# Patient Record
Sex: Female | Born: 1965 | Race: White | Hispanic: No | Marital: Single | Smoking: Current every day smoker
Health system: Southern US, Community
[De-identification: ages and names within clinical notes are randomized; demographics above are authoritative.]

## PROBLEM LIST (undated history)

## (undated) DIAGNOSIS — I10 Essential (primary) hypertension: Secondary | ICD-10-CM

## (undated) DIAGNOSIS — B1920 Unspecified viral hepatitis C without hepatic coma: Secondary | ICD-10-CM

## (undated) DIAGNOSIS — K7460 Unspecified cirrhosis of liver: Secondary | ICD-10-CM

## (undated) HISTORY — DX: Unspecified cirrhosis of liver: K74.60

## (undated) HISTORY — DX: Unspecified viral hepatitis C without hepatic coma: B19.20

## (undated) HISTORY — PX: ANKLE FRACTURE SURGERY: SHX122

---

## 1990-02-23 HISTORY — PX: CHOLECYSTECTOMY: SHX55

## 1995-02-24 HISTORY — PX: BACK SURGERY: SHX140

## 1997-05-24 ENCOUNTER — Emergency Department (HOSPITAL_COMMUNITY): Admission: EM | Admit: 1997-05-24 | Discharge: 1997-05-24 | Payer: Self-pay | Admitting: Emergency Medicine

## 1997-05-28 ENCOUNTER — Ambulatory Visit (HOSPITAL_COMMUNITY): Admission: RE | Admit: 1997-05-28 | Discharge: 1997-05-28 | Payer: Self-pay | Admitting: Orthopedic Surgery

## 1997-09-07 ENCOUNTER — Emergency Department (HOSPITAL_COMMUNITY): Admission: EM | Admit: 1997-09-07 | Discharge: 1997-09-07 | Payer: Self-pay | Admitting: Emergency Medicine

## 1997-10-31 ENCOUNTER — Emergency Department (HOSPITAL_COMMUNITY): Admission: EM | Admit: 1997-10-31 | Discharge: 1997-10-31 | Payer: Self-pay | Admitting: Emergency Medicine

## 1997-11-27 ENCOUNTER — Emergency Department (HOSPITAL_COMMUNITY): Admission: EM | Admit: 1997-11-27 | Discharge: 1997-11-27 | Payer: Self-pay | Admitting: Emergency Medicine

## 1998-02-20 ENCOUNTER — Emergency Department (HOSPITAL_COMMUNITY): Admission: EM | Admit: 1998-02-20 | Discharge: 1998-02-20 | Payer: Self-pay | Admitting: Emergency Medicine

## 1998-07-16 ENCOUNTER — Emergency Department (HOSPITAL_COMMUNITY): Admission: EM | Admit: 1998-07-16 | Discharge: 1998-07-16 | Payer: Self-pay | Admitting: Emergency Medicine

## 1998-08-27 ENCOUNTER — Emergency Department (HOSPITAL_COMMUNITY): Admission: EM | Admit: 1998-08-27 | Discharge: 1998-08-27 | Payer: Self-pay | Admitting: Emergency Medicine

## 1998-12-23 ENCOUNTER — Emergency Department (HOSPITAL_COMMUNITY): Admission: EM | Admit: 1998-12-23 | Discharge: 1998-12-23 | Payer: Self-pay | Admitting: Emergency Medicine

## 1998-12-23 ENCOUNTER — Encounter: Payer: Self-pay | Admitting: Emergency Medicine

## 2002-02-23 HISTORY — PX: HEEL SPUR SURGERY: SHX665

## 2002-05-25 HISTORY — PX: OTHER SURGICAL HISTORY: SHX169

## 2007-12-22 ENCOUNTER — Ambulatory Visit (HOSPITAL_COMMUNITY): Admission: RE | Admit: 2007-12-22 | Discharge: 2007-12-23 | Payer: Self-pay | Admitting: Obstetrics and Gynecology

## 2007-12-22 ENCOUNTER — Encounter (INDEPENDENT_AMBULATORY_CARE_PROVIDER_SITE_OTHER): Payer: Self-pay | Admitting: Obstetrics and Gynecology

## 2008-01-08 ENCOUNTER — Inpatient Hospital Stay (HOSPITAL_COMMUNITY): Admission: AD | Admit: 2008-01-08 | Discharge: 2008-01-11 | Payer: Self-pay | Admitting: Obstetrics and Gynecology

## 2008-01-09 ENCOUNTER — Encounter: Payer: Self-pay | Admitting: Obstetrics and Gynecology

## 2008-02-24 HISTORY — PX: ABDOMINAL HYSTERECTOMY: SHX81

## 2008-10-19 ENCOUNTER — Emergency Department: Payer: Self-pay | Admitting: Emergency Medicine

## 2009-03-02 ENCOUNTER — Emergency Department (HOSPITAL_COMMUNITY): Admission: EM | Admit: 2009-03-02 | Discharge: 2009-03-02 | Payer: Self-pay | Admitting: Emergency Medicine

## 2009-03-06 ENCOUNTER — Emergency Department (HOSPITAL_COMMUNITY): Admission: EM | Admit: 2009-03-06 | Discharge: 2009-03-06 | Payer: Self-pay | Admitting: Emergency Medicine

## 2009-08-26 ENCOUNTER — Emergency Department (HOSPITAL_COMMUNITY): Admission: EM | Admit: 2009-08-26 | Discharge: 2009-08-26 | Payer: Self-pay | Admitting: Emergency Medicine

## 2009-12-28 ENCOUNTER — Emergency Department (HOSPITAL_COMMUNITY): Admission: EM | Admit: 2009-12-28 | Discharge: 2009-12-28 | Payer: Self-pay | Admitting: Emergency Medicine

## 2010-02-01 ENCOUNTER — Emergency Department (HOSPITAL_COMMUNITY)
Admission: EM | Admit: 2010-02-01 | Discharge: 2010-02-01 | Payer: Self-pay | Source: Home / Self Care | Admitting: Emergency Medicine

## 2010-02-23 DIAGNOSIS — I10 Essential (primary) hypertension: Secondary | ICD-10-CM

## 2010-02-23 HISTORY — DX: Essential (primary) hypertension: I10

## 2010-03-09 ENCOUNTER — Emergency Department (HOSPITAL_COMMUNITY)
Admission: EM | Admit: 2010-03-09 | Discharge: 2010-03-10 | Payer: Self-pay | Source: Home / Self Care | Admitting: Emergency Medicine

## 2010-04-25 ENCOUNTER — Emergency Department (HOSPITAL_COMMUNITY): Payer: Self-pay

## 2010-04-25 ENCOUNTER — Emergency Department (HOSPITAL_COMMUNITY)
Admission: EM | Admit: 2010-04-25 | Discharge: 2010-04-26 | Disposition: A | Discharge status: Home / Self Care | Payer: Self-pay | Attending: Emergency Medicine | Admitting: Emergency Medicine

## 2010-04-25 DIAGNOSIS — Y929 Unspecified place or not applicable: Secondary | ICD-10-CM | POA: Insufficient documentation

## 2010-04-25 DIAGNOSIS — R079 Chest pain, unspecified: Secondary | ICD-10-CM | POA: Insufficient documentation

## 2010-04-25 DIAGNOSIS — M2569 Stiffness of other specified joint, not elsewhere classified: Secondary | ICD-10-CM | POA: Insufficient documentation

## 2010-04-25 DIAGNOSIS — I10 Essential (primary) hypertension: Secondary | ICD-10-CM | POA: Insufficient documentation

## 2010-04-25 DIAGNOSIS — S0003XA Contusion of scalp, initial encounter: Secondary | ICD-10-CM | POA: Insufficient documentation

## 2010-04-25 DIAGNOSIS — R51 Headache: Secondary | ICD-10-CM | POA: Insufficient documentation

## 2010-04-25 DIAGNOSIS — R4182 Altered mental status, unspecified: Secondary | ICD-10-CM | POA: Insufficient documentation

## 2010-04-25 DIAGNOSIS — S0083XA Contusion of other part of head, initial encounter: Secondary | ICD-10-CM | POA: Insufficient documentation

## 2010-04-25 DIAGNOSIS — F411 Generalized anxiety disorder: Secondary | ICD-10-CM | POA: Insufficient documentation

## 2010-04-25 DIAGNOSIS — IMO0002 Reserved for concepts with insufficient information to code with codable children: Secondary | ICD-10-CM | POA: Insufficient documentation

## 2010-04-25 DIAGNOSIS — F29 Unspecified psychosis not due to a substance or known physiological condition: Secondary | ICD-10-CM | POA: Insufficient documentation

## 2010-04-25 DIAGNOSIS — Z79899 Other long term (current) drug therapy: Secondary | ICD-10-CM | POA: Insufficient documentation

## 2010-04-25 LAB — URINE MICROSCOPIC-ADD ON

## 2010-04-25 LAB — URINALYSIS, ROUTINE W REFLEX MICROSCOPIC
Bilirubin Urine: NEGATIVE
Glucose, UA: NEGATIVE mg/dL
Hgb urine dipstick: NEGATIVE
Ketones, ur: NEGATIVE mg/dL
Nitrite: NEGATIVE
Protein, ur: NEGATIVE mg/dL
Specific Gravity, Urine: 1.012 (ref 1.005–1.030)
Urobilinogen, UA: 0.2 mg/dL (ref 0.0–1.0)
pH: 5.5 (ref 5.0–8.0)

## 2010-04-25 LAB — CBC
HCT: 35.8 % — ABNORMAL LOW (ref 36.0–46.0)
Hemoglobin: 12.3 g/dL (ref 12.0–15.0)
MCH: 32.9 pg (ref 26.0–34.0)
MCHC: 34.4 g/dL (ref 30.0–36.0)
MCV: 95.7 fL (ref 78.0–100.0)
Platelets: 157 10*3/uL (ref 150–400)
RBC: 3.74 MIL/uL — ABNORMAL LOW (ref 3.87–5.11)
RDW: 12.1 % (ref 11.5–15.5)
WBC: 10.6 10*3/uL — ABNORMAL HIGH (ref 4.0–10.5)

## 2010-04-25 LAB — DIFFERENTIAL
Basophils Absolute: 0 10*3/uL (ref 0.0–0.1)
Basophils Relative: 0 % (ref 0–1)
Eosinophils Absolute: 0.1 10*3/uL (ref 0.0–0.7)
Eosinophils Relative: 1 % (ref 0–5)
Lymphocytes Relative: 26 % (ref 12–46)
Lymphs Abs: 2.8 10*3/uL (ref 0.7–4.0)
Monocytes Absolute: 0.4 10*3/uL (ref 0.1–1.0)
Monocytes Relative: 4 % (ref 3–12)
Neutro Abs: 7.3 10*3/uL (ref 1.7–7.7)
Neutrophils Relative %: 69 % (ref 43–77)

## 2010-04-25 LAB — GLUCOSE, CAPILLARY: Glucose-Capillary: 83 mg/dL (ref 70–99)

## 2010-04-25 LAB — POCT I-STAT, CHEM 8
BUN: 11 mg/dL (ref 6–23)
Calcium, Ion: 1.17 mmol/L (ref 1.12–1.32)
Chloride: 107 mEq/L (ref 96–112)
Creatinine, Ser: 0.8 mg/dL (ref 0.4–1.2)
Glucose, Bld: 81 mg/dL (ref 70–99)
HCT: 37 % (ref 36.0–46.0)
Hemoglobin: 12.6 g/dL (ref 12.0–15.0)
Potassium: 3.8 mEq/L (ref 3.5–5.1)
Sodium: 141 mEq/L (ref 135–145)
TCO2: 24 mmol/L (ref 0–100)

## 2010-04-25 LAB — RAPID URINE DRUG SCREEN, HOSP PERFORMED
Amphetamines: NOT DETECTED
Barbiturates: NOT DETECTED
Benzodiazepines: POSITIVE — AB
Cocaine: NOT DETECTED
Opiates: NOT DETECTED
Tetrahydrocannabinol: NOT DETECTED

## 2010-04-25 LAB — PREGNANCY, URINE: Preg Test, Ur: NEGATIVE

## 2010-04-26 ENCOUNTER — Emergency Department (HOSPITAL_COMMUNITY): Payer: Self-pay

## 2010-04-26 ENCOUNTER — Encounter (HOSPITAL_COMMUNITY): Payer: Self-pay

## 2010-04-26 LAB — SALICYLATE LEVEL: Salicylate Lvl: <4 mg/dL (ref 2.8–20.0)

## 2010-04-26 LAB — ETHANOL: Alcohol, Ethyl (B): <5 mg/dL (ref 0–10)

## 2010-04-26 LAB — ACETAMINOPHEN LEVEL: Acetaminophen (Tylenol), Serum: <10 ug/mL — ABNORMAL LOW (ref 10–30)

## 2010-04-26 MED ORDER — IOHEXOL 300 MG/ML  SOLN
75.0000 mL | Freq: Once | INTRAMUSCULAR | Status: AC | PRN
  Administered 2010-04-26: 75 mL via INTRAVENOUS
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
## 2010-07-08 NOTE — Op Note (Signed)
[name], [name]                ACCOUNT NO.:  [id number]   MEDICAL RECORD NO.:  [id number]          PATIENT TYPE:  AMB   LOCATION:  SDC                           [facility]ILITY:  [facility]   PHYSICIAN:  Claire [name], M.D.DATE OF BIRTH:  02/26/1965   DATE OF PROCEDURE:  12/22/2007  DATE OF DISCHARGE:                               OPERATIVE REPORT   PREOPERATIVE DIAGNOSIS:  Chronic pelvic pain.   POSTOPERATIVE DIAGNOSIS:  Chronic pelvic pain.   PROCEDURE:  Laparoscopic-assisted vaginal hysterectomy and cystoscopy.   SURGEON:  Claire Niece, MD   ASSISTANT:  Claire Pro. Claire Mantle, MD   ANESTHESIA:  General endotracheal tube.   FINDINGS:  Claire [name] had a normal-appearing uterus, tubes and ovaries.  Claire [name]  had adhesions on the right upper quadrant from previous procedures.   SPECIMENS:  Uterus with cervix sent to Pathology.   ESTIMATED BLOOD LOSS:  400 mL.   COMPLICATIONS:  None.   PROCEDURE IN DETAIL:  The patient was taken to the operating room and  placed in the dorsal supine position.  Both arms were tucked to her  sides.  General anesthesia was induced and Claire [name] was placed in mobile  stirrups.  Abdomen, perineum and vagina were then prepped and draped in  the usual sterile fashion, bladder drained with a red Robinson catheter,  Hulka tenaculum applied to the cervix for uterine manipulation.  Infraumbilical skin was infiltrated with 0.25% Marcaine and a 3-cm  horizontal incision was made.  This was carried down to the fascia,  which was elevated with Kochers.  This was incised sharply.  The  posterior sheath was then identified and elevated and also entered  sharply with scissors.  Peritoneum was then entered bluntly.  A finger  was used to sweep around and make sure that there were no adhesions  under this adhesion from previous surgeries.  Army[employer] retractors were  used to expose the opening.  A pursestring suture of 0 Vicryl was placed  around the fascia and a Hassan cannula was  inserted and secured.  CO2  gas was insufflated and laparoscope was inserted.  The 5 mm ports were  then placed on each side under direct visualization.  There were  adhesions noted in the right upper quadrant from prior surgery.  The  pelvis had no significant adhesions.  Uterus, tubes and ovaries appeared  normal.  There was a suspicion of a possible hydrosalpinx on preop  ultrasound, but both tubes and ovaries appeared normal.  Using a  laparoscopic tenaculum to grab the fundus, I then used the harmonic  scalpel first on the left side to take down the round ligament, utero-  ovarian pedicles and broad ligaments.  I took this down to the level of  the uterine artery, but did not come across the anterior portion of the  peritoneum.  A similar procedure was then performed on the right side  with the harmonic scalpel.  Again, the anterior peritoneum was not  incised as it was a little bit difficult to delineate.  Bleeding from  the right uterine artery was controlled with the  harmonic scalpel.  At  this point, I elected to proceed vaginally.   Legs were elevated in stirrups.  A weighted speculum was inserted into  the vagina.  The cervix was grasped with Christella Hartigan tenaculums.  Deaver  retractors were used anteriorly and laterally.  Cervicovaginal mucosa  was infiltrated with a dilute solution of Pitressin.  This was then  incised circumferentially with electrocautery.  Sharp dissection was  then used to further free the vagina from the cervix.  The anterior  peritoneum was identified, entered sharply and a Deaver retractor used  to retract the bladder anteriorly.  Blood was evacuated with suction.  Posterior cul-de-sac was then easily identified and entered sharply.  A  bonnano speculum was placed into the posterior cul-de-sac.  Uterosacral  ligaments were clamped, transected and ligated with #1 chromic and  tagged for later use.  Uterine arteries and cardinal ligaments were  clamped,  transected and ligated on each side and the uterus was removed.  A small amount of bleeding from the patient's left side was controlled  with a figure-of-eight suture of #1 chromic.  All other pedicles  appeared to be hemostatic.  Again, tubes and ovaries appeared normal.  The uterosacral ligaments were then plicated in the midline with 2-0  silk.  The previously tagged uterosacral pedicles were also tied in the  midline.  The vaginal cuff was then closed in a vertical fashion with  running locking 2-0 Vicryl with adequate closure and adequate  hemostasis.   Attention was turned to cystoscopy.  The patient was given indigo  carmine IV.  A 70-degree cystoscope was inserted and 200 mL of sterile  solution was instilled.  The bladder appeared normal.  Both ureteral  orifices were easily identified and had good jets of indigo tinted  urine.  The cystoscope was removed and a Foley catheter was placed.   Attention was turned back to laparoscopy.  Both Dr. Ambrose [name] and myself  and the scrub tech changed gloves.  The laparoscope was inserted and the  pelvis was irrigated.  Bleeding from the left ovary and left pelvic  sidewall was controlled with the harmonic scalpel.  There was a small  amount of bleeding from the vaginal cuff.  I was not able to isolate  this through the laparoscope.  With Dr. Ambrose [name] looking through the  scope, I went back vaginally and placed a Graves speculum.  I then  placed a deep suture at the level in the vaginal cuff where I felt like  the bleeding was coming from.  This was a figure-of-eight suture of 0  Vicryl with good placement.  No further bleeding was noted from the  vaginal cuff.  I changed gloves again after all instruments were removed  from the vagina.  Irrigation again to the laparoscope revealed all  pedicles to be hemostatic.  The 5 mm ports were removed.  The Hassan  cannula was removed and all gas allowed to deflate from the abdomen.  The umbilical  incision was closed with the previously placed pursestring  suture and this felt adequate.  Skin incisions were closed with  interrupted subcuticular sutures of 4-0 Vicryl followed by Dermabond.  The patient was taken down from stirrups.  Claire [name] was awakened in the  operating room and taken to the recovery room in stable condition after  tolerating the procedure well.  Counts were correct, Claire [name] received Ancef  1 g IV at the beginning of the procedure and had PAS hose on throughout  the procedure.      Claire [name], M.D.  Electronically Signed     TDM/MEDQ  D:  12/22/2007  T:  12/22/2007  Job:  [id number]

## 2010-09-21 ENCOUNTER — Emergency Department (HOSPITAL_COMMUNITY)
Admission: EM | Admit: 2010-09-21 | Discharge: 2010-09-22 | Disposition: A | Discharge status: Home / Self Care | Payer: Self-pay | Attending: Emergency Medicine | Admitting: Emergency Medicine

## 2010-09-21 DIAGNOSIS — Z79899 Other long term (current) drug therapy: Secondary | ICD-10-CM | POA: Insufficient documentation

## 2010-09-21 DIAGNOSIS — M549 Dorsalgia, unspecified: Secondary | ICD-10-CM | POA: Insufficient documentation

## 2010-09-21 DIAGNOSIS — Z049 Encounter for examination and observation for unspecified reason: Secondary | ICD-10-CM | POA: Insufficient documentation

## 2010-09-21 DIAGNOSIS — F411 Generalized anxiety disorder: Secondary | ICD-10-CM | POA: Insufficient documentation

## 2010-09-21 DIAGNOSIS — I10 Essential (primary) hypertension: Secondary | ICD-10-CM | POA: Insufficient documentation

## 2010-11-25 LAB — CBC
HCT: 20.4 — ABNORMAL LOW
HCT: 22.7 — ABNORMAL LOW
HCT: 40
HCT: 25.1 — ABNORMAL LOW
HCT: 28.7 — ABNORMAL LOW
Hemoglobin: 13.5
Hemoglobin: 7 — CL
Hemoglobin: 7.8 — CL
Hemoglobin: 8.7 — ABNORMAL LOW
Hemoglobin: 9.9 — ABNORMAL LOW
MCHC: 33.8
MCHC: 34.4
MCHC: 34.5
MCHC: 34.5
MCHC: 34.7
MCV: 98.9
MCV: 98.9
MCV: 99.6
MCV: 100.6 — ABNORMAL HIGH
MCV: 99.7
Platelets: 114 — ABNORMAL LOW
Platelets: 145 — ABNORMAL LOW
Platelets: 195
Platelets: 164
Platelets: 226
RBC: 2.05 — ABNORMAL LOW
RBC: 2.29 — ABNORMAL LOW
RBC: 4.04
RBC: 2.52 — ABNORMAL LOW
RBC: 2.85 — ABNORMAL LOW
RDW: 13.1
RDW: 13.3
RDW: 13.6
RDW: 13.8
RDW: 14
WBC: 5.8
WBC: 6.4
WBC: 7.9
WBC: 4.9
WBC: 8.2

## 2010-11-25 LAB — COMPREHENSIVE METABOLIC PANEL
ALT: 12
AST: 23
Albumin: 2.8 — ABNORMAL LOW
Alkaline Phosphatase: 66
BUN: 2 — ABNORMAL LOW
CO2: 26
Calcium: 8.3 — ABNORMAL LOW
Chloride: 103
Creatinine, Ser: 0.48
GFR calc Af Amer: >60
GFR calc non Af Amer: >60
Glucose, Bld: 107 — ABNORMAL HIGH
Potassium: 3.6
Sodium: 136
Total Bilirubin: 0.6
Total Protein: 5.6 — ABNORMAL LOW

## 2010-11-25 LAB — PREGNANCY, URINE: Preg Test, Ur: NEGATIVE

## 2010-11-25 LAB — URINALYSIS, ROUTINE W REFLEX MICROSCOPIC
Bilirubin Urine: NEGATIVE
Glucose, UA: NEGATIVE
Hgb urine dipstick: NEGATIVE
Ketones, ur: NEGATIVE
Nitrite: NEGATIVE
Protein, ur: NEGATIVE
Specific Gravity, Urine: 1.01
Urobilinogen, UA: 1
pH: 7

## 2010-11-26 LAB — BODY FLUID CULTURE: Culture: NO GROWTH

## 2010-12-01 ENCOUNTER — Emergency Department (HOSPITAL_COMMUNITY)
Admission: EM | Admit: 2010-12-01 | Discharge: 2010-12-01 | Disposition: A | Discharge status: Home / Self Care | Payer: Self-pay | Attending: Emergency Medicine | Admitting: Emergency Medicine

## 2010-12-01 ENCOUNTER — Emergency Department (HOSPITAL_COMMUNITY): Payer: Self-pay

## 2010-12-01 DIAGNOSIS — S0003XA Contusion of scalp, initial encounter: Secondary | ICD-10-CM | POA: Insufficient documentation

## 2010-12-01 DIAGNOSIS — I10 Essential (primary) hypertension: Secondary | ICD-10-CM | POA: Insufficient documentation

## 2010-12-01 DIAGNOSIS — R51 Headache: Secondary | ICD-10-CM | POA: Insufficient documentation

## 2010-12-01 DIAGNOSIS — S0083XA Contusion of other part of head, initial encounter: Secondary | ICD-10-CM | POA: Insufficient documentation

## 2010-12-31 ENCOUNTER — Encounter (HOSPITAL_COMMUNITY): Payer: Self-pay

## 2010-12-31 ENCOUNTER — Emergency Department (HOSPITAL_COMMUNITY): Payer: Self-pay

## 2010-12-31 ENCOUNTER — Emergency Department (HOSPITAL_COMMUNITY)
Admission: EM | Admit: 2010-12-31 | Discharge: 2010-12-31 | Disposition: A | Discharge status: Home / Self Care | Payer: Self-pay | Attending: Emergency Medicine | Admitting: Emergency Medicine

## 2010-12-31 DIAGNOSIS — M719 Bursopathy, unspecified: Secondary | ICD-10-CM | POA: Insufficient documentation

## 2010-12-31 DIAGNOSIS — IMO0001 Reserved for inherently not codable concepts without codable children: Secondary | ICD-10-CM | POA: Insufficient documentation

## 2010-12-31 DIAGNOSIS — M542 Cervicalgia: Secondary | ICD-10-CM | POA: Insufficient documentation

## 2010-12-31 DIAGNOSIS — M25519 Pain in unspecified shoulder: Secondary | ICD-10-CM | POA: Insufficient documentation

## 2010-12-31 DIAGNOSIS — I10 Essential (primary) hypertension: Secondary | ICD-10-CM | POA: Insufficient documentation

## 2010-12-31 DIAGNOSIS — M7550 Bursitis of unspecified shoulder: Secondary | ICD-10-CM

## 2010-12-31 DIAGNOSIS — Z9889 Other specified postprocedural states: Secondary | ICD-10-CM | POA: Insufficient documentation

## 2010-12-31 DIAGNOSIS — R209 Unspecified disturbances of skin sensation: Secondary | ICD-10-CM | POA: Insufficient documentation

## 2010-12-31 DIAGNOSIS — F17200 Nicotine dependence, unspecified, uncomplicated: Secondary | ICD-10-CM | POA: Insufficient documentation

## 2010-12-31 DIAGNOSIS — Z79899 Other long term (current) drug therapy: Secondary | ICD-10-CM | POA: Insufficient documentation

## 2010-12-31 DIAGNOSIS — M79609 Pain in unspecified limb: Secondary | ICD-10-CM | POA: Insufficient documentation

## 2010-12-31 DIAGNOSIS — M7918 Myalgia, other site: Secondary | ICD-10-CM

## 2010-12-31 DIAGNOSIS — M67919 Unspecified disorder of synovium and tendon, unspecified shoulder: Secondary | ICD-10-CM | POA: Insufficient documentation

## 2010-12-31 HISTORY — DX: Essential (primary) hypertension: I10

## 2010-12-31 MED ORDER — CYCLOBENZAPRINE HCL 10 MG PO TABS: 10.0000 mg | ORAL_TABLET | Freq: Two times a day (BID) | ORAL | Status: DC | PRN

## 2010-12-31 MED ORDER — OXYCODONE-ACETAMINOPHEN 5-325 MG PO TABS: 1.0000 | ORAL_TABLET | ORAL | Status: AC | PRN

## 2010-12-31 MED ORDER — CYCLOBENZAPRINE HCL 10 MG PO TABS
10.0000 mg | ORAL_TABLET | Freq: Once | ORAL | Status: AC
  Administered 2010-12-31: 10 mg via ORAL
  Filled 2010-12-31: qty 1

## 2010-12-31 MED ORDER — IBUPROFEN 800 MG PO TABS
800.0000 mg | ORAL_TABLET | Freq: Once | ORAL | Status: AC
  Administered 2010-12-31: 800 mg via ORAL
  Filled 2010-12-31: qty 1

## 2010-12-31 MED ORDER — OXYCODONE-ACETAMINOPHEN 5-325 MG PO TABS
1.0000 | ORAL_TABLET | Freq: Once | ORAL | Status: AC
  Administered 2010-12-31: 1 via ORAL
  Filled 2010-12-31: qty 1

## 2010-12-31 MED ORDER — IBUPROFEN 800 MG PO TABS: 800.0000 mg | ORAL_TABLET | Freq: Three times a day (TID) | ORAL | Status: AC

## 2010-12-31 NOTE — ED Notes (Signed)
Complains of right shoulder pain for several days. Pain worse with movement on right arm. Denies injury but does carry heavy bookbag daily.

## 2010-12-31 NOTE — ED Provider Notes (Signed)
Medical screening examination/treatment/procedure(s) were performed by non-physician practitioner and as supervising physician I was immediately available for consultation/collaboration.   Forbes Cellar, MD 12/31/10 1100

## 2010-12-31 NOTE — ED Provider Notes (Signed)
History     CSN: [id number] Arrival date & time: 12/31/2010  9:26 AM   First MD Initiated Contact with Patient 12/31/10 1005      Chief Complaint  Patient presents with  . Shoulder Pain    right side.     (Consider location/radiation/quality/duration/timing/severity/associated sxs/prior treatment) Patient is a 45 y.o. female presenting with shoulder pain. The history is provided by the patient.  Shoulder Pain This is a new problem. The current episode started in the past 7 days. The problem occurs constantly. The problem has been gradually worsening. Associated symptoms comments: Pain started in right side neck radiating into right arm to hand, causing tingling in fingers 3-5. Shoulder pain started 1-2 days ago and has been progressively worse.Marland Kitchen    Past Medical History  Diagnosis Date  . Hypertension     Past Surgical History  Procedure Date  . Cesarean section   . Cholecystectomy   . Heel spur surgery   . Ankle fracture surgery   . Abdominal hysterectomy     History reviewed. No pertinent family history.  History  Substance Use Topics  . Smoking status: Current Everyday Smoker    Types: Cigarettes  . Smokeless tobacco: Not on file  . Alcohol Use: No    OB History    Grav Para Term Preterm Abortions TAB SAB Ect Mult Living                  Review of Systems  Constitutional: Negative.   Respiratory: Negative.   Cardiovascular: Negative.   Gastrointestinal: Negative.   Musculoskeletal:       See HPI.    Allergies  Review of patient's allergies indicates no known allergies.  Home Medications   Current Outpatient Rx  Name Route Sig Dispense Refill  . HYDROCHLOROTHIAZIDE 25 MG PO TABS Oral Take 25 mg by mouth daily.        BP 147/91  Pulse 91  Temp(Src) 98.1 F (36.7 C) (Oral)  Resp 16  SpO2 99%  Physical Exam  Constitutional: She appears well-developed and well-nourished.  HENT:  Head: Normocephalic.  Neck:       Right paracervical  tenderness with muscular tenseness without specific palpable spasm. No swelling. Tenderness extends to right trapezius.   Pulmonary/Chest: Effort normal.  Musculoskeletal:       Right shoulder point tender over bursa. No swelling or discoloration.  Neurological: She is alert. She has normal strength and normal reflexes.    ED Course  Procedures (including critical care time)  Labs Reviewed - No data to display Dg Shoulder Right  12/31/2010  *RADIOLOGY REPORT*  Clinical Data: Pain  RIGHT SHOULDER - 2+ VIEW  Comparison: None.  Findings: Negative for fracture, dislocation, or other acute abnormality.  Normal alignment and mineralization. No significant degenerative change.  Regional soft tissues unremarkable.  IMPRESSION:  Negative  Original Report Authenticated By: Osa Craver, M.D.     No diagnosis found.    MDM        Rodena Medin, PA 12/31/10 1058

## 2011-01-03 ENCOUNTER — Emergency Department (HOSPITAL_COMMUNITY)
Admission: EM | Admit: 2011-01-03 | Discharge: 2011-01-03 | Disposition: A | Discharge status: Home / Self Care | Payer: Self-pay | Attending: Emergency Medicine | Admitting: Emergency Medicine

## 2011-01-03 ENCOUNTER — Encounter (HOSPITAL_COMMUNITY): Payer: Self-pay | Admitting: Emergency Medicine

## 2011-01-03 DIAGNOSIS — F17200 Nicotine dependence, unspecified, uncomplicated: Secondary | ICD-10-CM | POA: Insufficient documentation

## 2011-01-03 DIAGNOSIS — I10 Essential (primary) hypertension: Secondary | ICD-10-CM | POA: Insufficient documentation

## 2011-01-03 DIAGNOSIS — M79609 Pain in unspecified limb: Secondary | ICD-10-CM | POA: Insufficient documentation

## 2011-01-03 DIAGNOSIS — M719 Bursopathy, unspecified: Secondary | ICD-10-CM | POA: Insufficient documentation

## 2011-01-03 DIAGNOSIS — M75101 Unspecified rotator cuff tear or rupture of right shoulder, not specified as traumatic: Secondary | ICD-10-CM

## 2011-01-03 DIAGNOSIS — R61 Generalized hyperhidrosis: Secondary | ICD-10-CM | POA: Insufficient documentation

## 2011-01-03 DIAGNOSIS — M67919 Unspecified disorder of synovium and tendon, unspecified shoulder: Secondary | ICD-10-CM | POA: Insufficient documentation

## 2011-01-03 MED ORDER — OXYCODONE-ACETAMINOPHEN 5-325 MG PO TABS
1.0000 | ORAL_TABLET | Freq: Once | ORAL | Status: AC
  Administered 2011-01-03: 1 via ORAL
  Filled 2011-01-03: qty 1

## 2011-01-03 MED ORDER — METHOCARBAMOL 500 MG PO TABS: 500.0000 mg | ORAL_TABLET | Freq: Four times a day (QID) | ORAL | Status: AC

## 2011-01-03 MED ORDER — IBUPROFEN 800 MG PO TABS: 800.0000 mg | ORAL_TABLET | Freq: Three times a day (TID) | ORAL | Status: AC

## 2011-01-03 NOTE — ED Provider Notes (Signed)
History     CSN: [id number] Arrival date & time: 01/03/2011  3:24 PM   First MD Initiated Contact with Patient 01/03/11 1559      Chief Complaint  Patient presents with  . Arm Pain    persistant r/arm pain    (Consider location/radiation/quality/duration/timing/severity/associated sxs/prior treatment) HPI  Patient presents to emergency department complaining of a greater than week history of gradual onset right shoulder pain. Patient was seen in [facility] emergency department 3 days ago for evaluation of the same pain. At that time patient states she had an x-ray that showed no acute findings despite patient stating there is no injury to her shoulder. Patient states she was written a prescription of Percocet however she gave the prescription to a female friend to have it filled for her however she cannot contact  friend to give her the medicine. 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 Patient states range of motion aggravates pain. Patient states keeping shoulder down by her side improves pain. Patient states she's been taking at home Motrin with mild relief of pain but persistent symptoms. Patient denies redness, swelling, or heat of her right shoulder. Denies radiation of pain outside of her shoulder but states intermittent tingling sensation into hand. Denies numbness of right upper extremity.  History was obtained by patient and prior chart.  Past Medical History  Diagnosis Date  . Hypertension     Past Surgical History  Procedure Date  . Cesarean section   . Cholecystectomy   . Heel spur surgery   . Ankle fracture surgery   . Abdominal hysterectomy     No family history on file.  History  Substance Use Topics  . Smoking status: Current Everyday Smoker    Types: Cigarettes  . Smokeless tobacco: Not on file  . Alcohol Use: No    OB History    Grav Para Term  Preterm Abortions TAB SAB Ect Mult Living                  Review of Systems  All other systems reviewed and are negative.    Allergies  Review of patient's allergies indicates no known allergies.  Home Medications   Current Outpatient Rx  Name Route Sig Dispense Refill  . HYDROCHLOROTHIAZIDE 25 MG PO TABS Oral Take 25 mg by mouth daily.     . IBUPROFEN 800 MG PO TABS Oral Take 1 tablet (800 mg total) by mouth 3 (three) times daily. 21 tablet 0  . IBUPROFEN 800 MG PO TABS Oral Take 1 tablet (800 mg total) by mouth 3 (three) times daily. 21 tablet 0  . METHOCARBAMOL 500 MG PO TABS Oral Take 1 tablet (500 mg total) by mouth 4 (four) times daily. 20 tablet 0  . OXYCODONE-ACETAMINOPHEN 5-325 MG PO TABS Oral Take 1 tablet by mouth every 4 (four) hours as needed for pain. 15 tablet 0    BP 125/92  Pulse 91  Temp(Src) 98.4 F (36.9 C) (Oral)  Resp 20  SpO2 100%  Physical Exam  Constitutional: She is oriented to person, place, and time. She appears well-developed and well-nourished. No distress.  HENT:  Head: Normocephalic and atraumatic.  Eyes: Conjunctivae are normal.  Neck: Normal range of motion. Neck supple.  Cardiovascular: Normal rate and regular rhythm.   Pulmonary/Chest: Effort normal and breath sounds normal.  Abdominal: Soft. There is no tenderness.  Neurological: She is alert and oriented to person, place, and time.       Pain with range of motion of right shoulder with increased pain with external rotation and abduction. No erythema heat or swelling of joints. Mild tenderness to palpation of soft tissue of upper shoulder into the lateral neck but no midline tenderness to palpation. Full range of motion of right elbow and hand without any pain. Good radial pulse and Refill of digits as well as normal sensation  Skin: Skin is warm. No rash noted. She is diaphoretic. No erythema.  Psychiatric: She has a normal mood and affect. Her behavior is normal.    ED Course    Procedures (including critical care time)  Labs Reviewed - No data to display No results found.   1. Rotator cuff syndrome of right shoulder       MDM  Patient has pain with range of motion particularly external rotation suggesting possible rotator cuff syndrome but no signs or symptoms of septic joint with patient seen 3 days ago in ER for same complaints of shoulder pain with negative x-ray at that time. Spoke at length with patient that it is very important for her to find her prescription of Percocet but that another prescription would not be rewritten for today. Ultimately patient was instructed to followup with an orthopedic doctor for further evaluation and management of shoulder pain. Right upper extremity is neurovascularly intact.        Jenness Corner, [place] 01/03/11 [phone]

## 2011-01-04 NOTE — ED Provider Notes (Signed)
Medical screening examination/treatment/procedure(s) were performed by non-physician practitioner and as supervising physician I was immediately available for consultation/collaboration.  Hurman Horn, MD 01/04/11 1240

## 2011-07-16 ENCOUNTER — Emergency Department (HOSPITAL_COMMUNITY): Payer: Self-pay

## 2011-07-16 ENCOUNTER — Emergency Department (HOSPITAL_COMMUNITY)
Admission: EM | Admit: 2011-07-16 | Discharge: 2011-07-16 | Disposition: A | Discharge status: Home / Self Care | Payer: Self-pay | Attending: Emergency Medicine | Admitting: Emergency Medicine

## 2011-07-16 ENCOUNTER — Encounter (HOSPITAL_COMMUNITY): Payer: Self-pay

## 2011-07-16 DIAGNOSIS — F17200 Nicotine dependence, unspecified, uncomplicated: Secondary | ICD-10-CM | POA: Insufficient documentation

## 2011-07-16 DIAGNOSIS — I10 Essential (primary) hypertension: Secondary | ICD-10-CM | POA: Insufficient documentation

## 2011-07-16 DIAGNOSIS — S8000XA Contusion of unspecified knee, initial encounter: Secondary | ICD-10-CM | POA: Insufficient documentation

## 2011-07-16 DIAGNOSIS — M25569 Pain in unspecified knee: Secondary | ICD-10-CM | POA: Insufficient documentation

## 2011-07-16 DIAGNOSIS — W010XXA Fall on same level from slipping, tripping and stumbling without subsequent striking against object, initial encounter: Secondary | ICD-10-CM | POA: Insufficient documentation

## 2011-07-16 DIAGNOSIS — M25562 Pain in left knee: Secondary | ICD-10-CM

## 2011-07-16 MED ORDER — HYDROCODONE-ACETAMINOPHEN 5-500 MG PO TABS: 1.0000 | ORAL_TABLET | Freq: Four times a day (QID) | ORAL | Status: AC | PRN

## 2011-07-16 MED ORDER — IBUPROFEN 200 MG PO TABS
600.0000 mg | ORAL_TABLET | Freq: Once | ORAL | Status: AC
  Administered 2011-07-16: 600 mg via ORAL
  Filled 2011-07-16: qty 3

## 2011-07-16 NOTE — Progress Notes (Signed)
Orthopedic Tech Progress Note Patient Details:  Claire [name] [birth year] [id number]  Other Ortho Devices Type of Ortho Device: Knee Immobilizer;Crutches Ortho Device Interventions: Application   Cammer, Mickie Bail 07/16/2011, 11:09 AM

## 2011-07-16 NOTE — ED Notes (Signed)
Ortho at bedside.

## 2011-07-16 NOTE — Discharge Instructions (Signed)
Knee Pain The knee is the complex joint between your thigh and your lower leg. It is made up of bones, tendons, ligaments, and cartilage. The bones that make up the knee are:  The femur in the thigh.   The tibia and fibula in the lower leg.   The patella or kneecap riding in the groove on the lower femur.  CAUSES  Knee pain is a common complaint with many causes. A few of these causes are:  Injury, such as:   A ruptured ligament or tendon injury.   Torn cartilage.   Medical conditions, such as:   Gout   Arthritis   Infections   Overuse, over training or overdoing a physical activity.  Knee pain can be minor or severe. Knee pain can accompany debilitating injury. Minor knee problems often respond well to self-care measures or get well on their own. More serious injuries may need medical intervention or even surgery. SYMPTOMS The knee is complex. Symptoms of knee problems can vary widely. Some of the problems are:  Pain with movement and weight bearing.   Swelling and tenderness.   Buckling of the knee.   Inability to straighten or extend your knee.   Your knee locks and you cannot straighten it.   Warmth and redness with pain and fever.   Deformity or dislocation of the kneecap.  DIAGNOSIS  Determining what is wrong may be very straight forward such as when there is an injury. It can also be challenging because of the complexity of the knee. Tests to make a diagnosis may include:  Your caregiver taking a history and doing a physical exam.   Routine X-rays can be used to rule out other problems. X-rays will not reveal a cartilage tear. Some injuries of the knee can be diagnosed by:   Arthroscopy a surgical technique by which a small video camera is inserted through tiny incisions on the sides of the knee. This procedure is used to examine and repair internal knee joint problems. Tiny instruments can be used during arthroscopy to repair the torn knee cartilage  (meniscus).   Arthrography is a radiology technique. A contrast liquid is directly injected into the knee joint. Internal structures of the knee joint then become visible on X-ray film.   An MRI scan is a non x-ray radiology procedure in which magnetic fields and a computer produce two- or three-dimensional images of the inside of the knee. Cartilage tears are often visible using an MRI scanner. MRI scans have largely replaced arthrography in diagnosing cartilage tears of the knee.   Blood work.   Examination of the fluid that helps to lubricate the knee joint (synovial fluid). This is done by taking a sample out using a needle and a syringe.  TREATMENT The treatment of knee problems depends on the cause. Some of these treatments are:  Depending on the injury, proper casting, splinting, surgery or physical therapy care will be needed.   Give yourself adequate recovery time. Do not overuse your joints. If you begin to get sore during workout routines, back off. Slow down or do fewer repetitions.   For repetitive activities such as cycling or running, maintain your strength and nutrition.   Alternate muscle groups. For example if you are a weight lifter, work the upper body on one day and the lower body the next.   Either tight or weak muscles do not give the proper support for your knee. Tight or weak muscles do not absorb the stress placed   on the knee joint. Keep the muscles surrounding the knee strong.   Take care of mechanical problems.   If you have flat feet, orthotics or special shoes may help. See your caregiver if you need help.   Arch supports, sometimes with wedges on the inner or outer aspect of the heel, can help. These can shift pressure away from the side of the knee most bothered by osteoarthritis.   A brace called an "unloader" brace also may be used to help ease the pressure on the most arthritic side of the knee.   If your caregiver has prescribed crutches, braces,  wraps or ice, use as directed. The acronym for this is PRICE. This means protection, rest, ice, compression and elevation.   Nonsteroidal anti-inflammatory drugs (NSAID's), can help relieve pain. But if taken immediately after an injury, they may actually increase swelling. Take NSAID's with food in your stomach. Stop them if you develop stomach problems. Do not take these if you have a history of ulcers, stomach pain or bleeding from the bowel. Do not take without your caregiver's approval if you have problems with fluid retention, heart failure, or kidney problems.   For ongoing knee problems, physical therapy may be helpful.   Glucosamine and chondroitin are over-the-counter dietary supplements. Both may help relieve the pain of osteoarthritis in the knee. These medicines are different from the usual anti-inflammatory drugs. Glucosamine may decrease the rate of cartilage destruction.   Injections of a corticosteroid drug into your knee joint may help reduce the symptoms of an arthritis flare-up. They may provide pain relief that lasts a few months. You may have to wait a few months between injections. The injections do have a small increased risk of infection, water retention and elevated blood sugar levels.   Hyaluronic acid injected into damaged joints may ease pain and provide lubrication. These injections may work by reducing inflammation. A series of shots may give relief for as long as 6 months.   Topical painkillers. Applying certain ointments to your skin may help relieve the pain and stiffness of osteoarthritis. Ask your pharmacist for suggestions. Many over the-counter products are approved for temporary relief of arthritis pain.   In some countries, doctors often prescribe topical NSAID's for relief of chronic conditions such as arthritis and tendinitis. A review of treatment with NSAID creams found that they worked as well as oral medications but without the serious side effects.    PREVENTION  Maintain a healthy weight. Extra pounds put more strain on your joints.   Get strong, stay limber. Weak muscles are a common cause of knee injuries. Stretching is important. Include flexibility exercises in your workouts.   Be smart about exercise. If you have osteoarthritis, chronic knee pain or recurring injuries, you may need to change the way you exercise. This does not mean you have to stop being active. If your knees ache after jogging or playing basketball, consider switching to swimming, water aerobics or other low-impact activities, at least for a few days a week. Sometimes limiting high-impact activities will provide relief.   Make sure your shoes fit well. Choose footwear that is right for your sport.   Protect your knees. Use the proper gear for knee-sensitive activities. Use kneepads when playing volleyball or laying carpet. Buckle your seat belt every time you drive. Most shattered kneecaps occur in car accidents.   Rest when you are tired.  SEEK MEDICAL CARE IF:  You have knee pain that is continual and does not   seem to be getting better.  SEEK IMMEDIATE MEDICAL CARE IF:  Your knee joint feels hot to the touch and you have a high fever. MAKE SURE YOU:   Understand these instructions.   Will watch your condition.   Will get help right away if you are not doing well or get worse.  Document Released: 12/07/2006 Document Revised: 01/29/2011 Document Reviewed: 12/07/2006 [employer] Patient Information 2012 [employer]. 

## 2011-07-16 NOTE — ED Provider Notes (Signed)
History     CSN: [id number]  Arrival date & time 07/16/11  0909   First MD Initiated Contact with Patient 07/16/11 [phone]      Chief Complaint  Patient presents with  . Knee Pain    (Consider location/radiation/quality/duration/timing/severity/associated sxs/prior treatment) HPI Comments: Tripped and fell at work.  Pain in left knee since.  Pain with weight bearing.  No other injury.  Patient is a 46 y.o. female presenting with knee pain. The history is provided by the patient.  Knee Pain This is a new problem. The current episode started today. The problem occurs constantly. The problem has been unchanged. Pertinent negatives include no abdominal pain, chest pain, congestion, fever, joint swelling or rash. The symptoms are aggravated by bending. She has tried nothing for the symptoms.    Past Medical History  Diagnosis Date  . Hypertension     Past Surgical History  Procedure Date  . Cesarean section   . Cholecystectomy   . Heel spur surgery   . Ankle fracture surgery   . Abdominal hysterectomy     History reviewed. No pertinent family history.  History  Substance Use Topics  . Smoking status: Current Everyday Smoker    Types: Cigarettes  . Smokeless tobacco: Not on file  . Alcohol Use: No    OB History    Grav Para Term Preterm Abortions TAB SAB Ect Mult Living                  Review of Systems  Constitutional: Negative for fever and activity change.  HENT: Negative for congestion.   Eyes: Negative for visual disturbance.  Respiratory: Negative for chest tightness and shortness of breath.   Cardiovascular: Negative for chest pain and leg swelling.  Gastrointestinal: Negative for abdominal pain.  Genitourinary: Negative for dysuria.  Musculoskeletal: Negative for joint swelling.  Skin: Negative for rash.  Neurological: Negative for syncope.  Psychiatric/Behavioral: Negative for behavioral problems.    Allergies  Review of patient's allergies  indicates no known allergies.  Home Medications   Current Outpatient Rx  Name Route Sig Dispense Refill  . ALPRAZOLAM PO Oral Take 1 tablet by mouth daily as needed. For nerves    . LORTAB PO Oral Take 1 tablet by mouth 2 (two) times daily as needed. For pain    . IBUPROFEN 200 MG PO TABS Oral Take 400 mg by mouth every 6 (six) hours as needed. For pain    . HYDROCODONE-ACETAMINOPHEN 5-500 MG PO TABS Oral Take 1 tablet by mouth every 6 (six) hours as needed for pain. 10 tablet 0    BP 132/83  Pulse 79  Temp(Src) 98.1 F (36.7 C) (Oral)  Resp 18  SpO2 99%  Physical Exam  Constitutional: She appears well-developed and well-nourished.  HENT:  Head: Normocephalic and atraumatic.  Neck: Neck supple.  Pulmonary/Chest: Effort normal. No respiratory distress.  Musculoskeletal: She exhibits no edema and no tenderness.       Small echymosis over left patella.  Full ROM.  Negative ant/post drawer.  Negative McMurrays.  Moving toes, knees.  2+ DP.  No effusion.  No warmth.  Neurological: She is alert.    ED Course  Procedures (including critical care time)  Labs Reviewed - No data to display Dg Knee Ap/lat W/sunrise Left  07/16/2011  *RADIOLOGY REPORT*  Clinical Data: Tripped and fell landing on left knee, severe pain anteriorly  DG KNEE - 3 VIEWS  Comparison: None  Findings: Bone mineralization normal. Joint  spaces preserved. No fracture or dislocation. Subtle subchondral lucency at the anterior margin of the medial femoral condyle. No joint effusion.  IMPRESSION: Focal subchondral lucency at the anterior margin of medial femoral condyle, could potentially be degenerative in origin but cannot exclude subtle osteochondral injury or contusion. No other focal bony abnormalities identified.  Original Report Authenticated By: Lollie Marrow, M.D.     1. Left knee pain       MDM  Tripped and fell at work.  Pain in left knee since.  Pain with weight bearing.  No other injury.  Mild  echymosis on knee exam.  No ligamentous instability.  N/V intact.  Xray without clear fx.  Read as possible contusion.  Will place in knee immobilizer.  Crutches.  Pain meds.  Ortho f/u.  Pt comfortable with plan and will follow up.          Army Chaco, MD  I saw and evaluated the patient, reviewed the resident's note and I agree with the findings and plan. Lamonica Trueba, [facility]  07/16/11 1100  Cyndra Numbers, MD 07/21/11 0800

## 2011-10-26 ENCOUNTER — Encounter (HOSPITAL_COMMUNITY): Payer: Self-pay | Admitting: *Deleted

## 2011-10-26 ENCOUNTER — Emergency Department (HOSPITAL_COMMUNITY)
Admission: EM | Admit: 2011-10-26 | Discharge: 2011-10-26 | Disposition: A | Discharge status: Home / Self Care | Payer: Self-pay | Attending: Emergency Medicine | Admitting: Emergency Medicine

## 2011-10-26 DIAGNOSIS — Z9089 Acquired absence of other organs: Secondary | ICD-10-CM | POA: Insufficient documentation

## 2011-10-26 DIAGNOSIS — I10 Essential (primary) hypertension: Secondary | ICD-10-CM | POA: Insufficient documentation

## 2011-10-26 DIAGNOSIS — K047 Periapical abscess without sinus: Secondary | ICD-10-CM | POA: Insufficient documentation

## 2011-10-26 DIAGNOSIS — F17200 Nicotine dependence, unspecified, uncomplicated: Secondary | ICD-10-CM | POA: Insufficient documentation

## 2011-10-26 MED ORDER — OXYCODONE-ACETAMINOPHEN 5-325 MG PO TABS: 1.0000 | ORAL_TABLET | Freq: Four times a day (QID) | ORAL | Status: DC | PRN

## 2011-10-26 MED ORDER — MORPHINE SULFATE 4 MG/ML IJ SOLN
4.0000 mg | Freq: Once | INTRAMUSCULAR | Status: AC
  Administered 2011-10-26: 4 mg via INTRAVENOUS
  Filled 2011-10-26: qty 1

## 2011-10-26 MED ORDER — CLINDAMYCIN PHOSPHATE 600 MG/50ML IV SOLN
600.0000 mg | Freq: Once | INTRAVENOUS | Status: AC
  Administered 2011-10-26: 600 mg via INTRAVENOUS
  Filled 2011-10-26: qty 50

## 2011-10-26 MED ORDER — CLINDAMYCIN HCL 150 MG PO CAPS
300.0000 mg | ORAL_CAPSULE | Freq: Four times a day (QID) | ORAL | Status: DC
Start: 2011-10-26 — End: 2011-10-28

## 2011-10-26 MED ORDER — ONDANSETRON HCL 4 MG/2ML IJ SOLN
4.0000 mg | Freq: Once | INTRAMUSCULAR | Status: AC
  Administered 2011-10-26: 4 mg via INTRAVENOUS
  Filled 2011-10-26: qty 2

## 2011-10-26 NOTE — ED Notes (Signed)
[id number]> Expected date:<BR> Expected time:<BR> Means of arrival:<BR> Comments:<BR> 46yoF- gum abcess; &quot;feels like she is going to pass out&quot;

## 2011-10-26 NOTE — ED Provider Notes (Signed)
History     CSN: [id number]  Arrival date & time 10/26/11  1336   First MD Initiated Contact with Patient 10/26/11 1358      Chief Complaint  Patient presents with  . Facial Swelling    (Consider location/radiation/quality/duration/timing/severity/associated sxs/prior treatment) The history is provided by the patient.   patient is complaining of left side facial pain and swelling. Been going for couple days. Ice is no longer effective. She states she has a bad tooth and is to see her dentist. Mild headache. No clear fevers. No trouble seeing. No trauma. She states she has not been able to see her dentist yet. She does feel somewhat lightheaded. She's not had swelling this before.  Past Medical History  Diagnosis Date  . Hypertension     Past Surgical History  Procedure Date  . Cesarean section   . Cholecystectomy   . Heel spur surgery   . Ankle fracture surgery   . Abdominal hysterectomy     History reviewed. No pertinent family history.  History  Substance Use Topics  . Smoking status: Current Everyday Smoker    Types: Cigarettes  . Smokeless tobacco: Not on file  . Alcohol Use: No    OB History    Grav Para Term Preterm Abortions TAB SAB Ect Mult Living                  Review of Systems  Constitutional: Negative for chills and fatigue.  HENT: Negative for nosebleeds, mouth sores and trouble swallowing.   Genitourinary: Negative for flank pain and pelvic pain.  Musculoskeletal: Negative for back pain.  Neurological: Negative for syncope and headaches.    Allergies  Review of patient's allergies indicates no known allergies.  Home Medications   Current Outpatient Rx  Name Route Sig Dispense Refill  . CLINDAMYCIN HCL 150 MG PO CAPS Oral Take 2 capsules (300 mg total) by mouth every 6 (six) hours. 56 capsule 0  . OXYCODONE-ACETAMINOPHEN 5-325 MG PO TABS Oral Take 1-2 tablets by mouth every 6 (six) hours as needed for pain. 20 tablet 0    BP 115/76   Pulse 68  Temp 98 F (36.7 C) (Oral)  Resp 18  Ht 5\' 5"  (1.651 m)  Wt 141 lb (63.957 kg)  BMI 23.46 kg/m2  SpO2 98%  Physical Exam  Constitutional: She is oriented to person, place, and time. She appears well-developed and well-nourished.  HENT:       Swelling to left cheek. No clear fluctuance. Mild edema below it. No induration of skin. There is tenderness on the upper jaw on the mucosal surface and laterally from approximately the left upper third tooth. No fluctuance. There is swelling. Extraocular movements are intact.  Eyes: EOM are normal. Pupils are equal, round, and reactive to light.  Neck: Normal range of motion. Neck supple.  Cardiovascular: Normal rate.   Pulmonary/Chest: Effort normal.  Abdominal: Soft.  Musculoskeletal: Normal range of motion.  Neurological: She is alert and oriented to person, place, and time.    ED Course  Procedures (including critical care time)  Labs Reviewed - No data to display No results found.   1. Dental abscess       MDM  Patient with dental abscess with some extension to the face. Patient is overall well-appearing. She's given dose of IV clindamycin. She is nondiabetic. She was given oral clindamycin and pain medicines. There is no clear drainable abscess at this time. She'll followup in one to 2  days with either dentistry or back in the ER.        Juliet Rude. Rubin Payor, MD 10/26/11 1550

## 2011-10-26 NOTE — ED Notes (Signed)
Patient noticed pain and swelling to left side of face. It was initially relieved by ice, but became progressively worse and ice packs were no longer effective. Patient called for EMS this AM due to the increased swelling and pain. She states her pain at a 10/10. Tearful.  Left side of face is swollen and some reddness, especially under the left eye.

## 2011-10-27 ENCOUNTER — Emergency Department (HOSPITAL_COMMUNITY)
Admission: EM | Admit: 2011-10-27 | Discharge: 2011-10-28 | Disposition: A | Discharge status: Home / Self Care | Payer: Self-pay | Attending: Emergency Medicine | Admitting: Emergency Medicine

## 2011-10-27 ENCOUNTER — Encounter (HOSPITAL_COMMUNITY): Payer: Self-pay | Admitting: Family Medicine

## 2011-10-27 DIAGNOSIS — F17200 Nicotine dependence, unspecified, uncomplicated: Secondary | ICD-10-CM | POA: Insufficient documentation

## 2011-10-27 DIAGNOSIS — L0390 Cellulitis, unspecified: Secondary | ICD-10-CM

## 2011-10-27 DIAGNOSIS — L0201 Cutaneous abscess of face: Secondary | ICD-10-CM | POA: Insufficient documentation

## 2011-10-27 DIAGNOSIS — I10 Essential (primary) hypertension: Secondary | ICD-10-CM | POA: Insufficient documentation

## 2011-10-27 DIAGNOSIS — L03211 Cellulitis of face: Secondary | ICD-10-CM | POA: Insufficient documentation

## 2011-10-27 NOTE — ED Notes (Signed)
Pt has facial abscess that she was seen for yesterday. States she could not afford the antibiotic rx that she received. Reports swelling is getting worse to left side of face and down neck.

## 2011-10-28 MED ORDER — OXYCODONE-ACETAMINOPHEN 5-325 MG PO TABS: 1.0000 | ORAL_TABLET | Freq: Four times a day (QID) | ORAL | Status: AC | PRN

## 2011-10-28 MED ORDER — CEPHALEXIN 500 MG PO CAPS: 500.0000 mg | ORAL_CAPSULE | Freq: Four times a day (QID) | ORAL | Status: AC

## 2011-10-28 MED ORDER — MORPHINE SULFATE 4 MG/ML IJ SOLN
4.0000 mg | Freq: Once | INTRAMUSCULAR | Status: AC
  Administered 2011-10-28: 4 mg via INTRAVENOUS
  Filled 2011-10-28: qty 1

## 2011-10-28 MED ORDER — SULFAMETHOXAZOLE-TRIMETHOPRIM 800-160 MG PO TABS: 1.0000 | ORAL_TABLET | Freq: Two times a day (BID) | ORAL | Status: DC

## 2011-10-28 MED ORDER — CEPHALEXIN 500 MG PO CAPS: 500.0000 mg | ORAL_CAPSULE | Freq: Four times a day (QID) | ORAL | Status: DC

## 2011-10-28 MED ORDER — CLINDAMYCIN PHOSPHATE 300 MG/50ML IV SOLN
300.0000 mg | Freq: Once | INTRAVENOUS | Status: AC
  Administered 2011-10-28: 300 mg via INTRAVENOUS
  Filled 2011-10-28: qty 50

## 2011-10-28 MED ORDER — ONDANSETRON HCL 4 MG/2ML IJ SOLN
4.0000 mg | Freq: Once | INTRAMUSCULAR | Status: AC
  Administered 2011-10-28: 4 mg via INTRAVENOUS
  Filled 2011-10-28: qty 2

## 2011-10-28 MED ORDER — SULFAMETHOXAZOLE-TRIMETHOPRIM 800-160 MG PO TABS: 1.0000 | ORAL_TABLET | Freq: Two times a day (BID) | ORAL | Status: AC

## 2011-10-28 NOTE — ED Notes (Signed)
Patient is alert and oriented x3.  She was given DC instructions and follow up visit instructions.  Patient gave verbal understanding. She was DC ambulatory under his own power to home.  V/S stable.  He was not showing any signs of distress on DC 

## 2011-10-28 NOTE — ED Provider Notes (Signed)
Medical screening examination/treatment/procedure(s) were performed by non-physician practitioner and as supervising physician I was immediately available for consultation/collaboration.  Sunnie Nielsen, MD 10/28/11 0400

## 2011-10-28 NOTE — ED Provider Notes (Signed)
History     CSN: [id number]  Arrival date & time 10/27/11  2242   First MD Initiated Contact with Patient 10/28/11 0122      Chief Complaint  Patient presents with  . Abscess    (Consider location/radiation/quality/duration/timing/severity/associated sxs/prior treatment) HPI  Patient presents to the emergency department for change in her antibiotic. She was seen here yesterday and diagnosed with facial cellulitis and treated with clindamycin IV. At that time it was found that she had no drainable abscess. She was given by mouth clindamycin prescription for home as well as Percocet. The patient states that medicine costs $60 and she is unable to afford that. She would like to know if we can change her antibiotic or help her get the antibiotic that she needs. She denies having any fevers, nausea, vomiting, diarrhea or weakness. She does admit that swelling is mildly worse. But only mildly worse. Her vital signs are stable she is in no acute distress  Past Medical History  Diagnosis Date  . Hypertension     Past Surgical History  Procedure Date  . Cesarean section   . Cholecystectomy   . Heel spur surgery   . Ankle fracture surgery   . Abdominal hysterectomy     History reviewed. No pertinent family history.  History  Substance Use Topics  . Smoking status: Current Everyday Smoker    Types: Cigarettes  . Smokeless tobacco: Not on file  . Alcohol Use: No    OB History    Grav Para Term Preterm Abortions TAB SAB Ect Mult Living                  Review of Systems   Review of Systems  Gen: no weight loss, fevers, chills, night sweats  Eyes: no discharge or drainage, no occular pain or visual changes  Nose: no epistaxis or rhinorrhea  Mouth: no dental pain, no sore throat  Neck: mild cellulitis to neck Lungs:No wheezing, coughing or hemoptysis CV: no chest pain, palpitations, dependent edema or orthopnea  Abd: no abdominal pain, nausea, vomiting  GU: no dysuria or  gross hematuria  MSK:  No abnormalities  Neuro: no headache, no focal neurologic deficits  Skin: + cellulitis Psyche: negative.    Allergies  Review of patient's allergies indicates no known allergies.  Home Medications   Current Outpatient Rx  Name Route Sig Dispense Refill  . CLINDAMYCIN HCL 150 MG PO CAPS Oral Take 150 mg by mouth 4 (four) times daily. For 14 days.    . OXYCODONE-ACETAMINOPHEN 5-325 MG PO TABS Oral Take 1-2 tablets by mouth every 6 (six) hours as needed. For pain    . CEPHALEXIN 500 MG PO CAPS Oral Take 1 capsule (500 mg total) by mouth 4 (four) times daily. 28 capsule 0  . SULFAMETHOXAZOLE-TRIMETHOPRIM 800-160 MG PO TABS Oral Take 1 tablet by mouth every 12 (twelve) hours. 20 tablet 0    BP 122/90  Pulse 88  Temp 99.1 F (37.3 C) (Oral)  Resp 18  SpO2 95%  Physical Exam  Nursing note and vitals reviewed. Constitutional: She appears well-developed and well-nourished. No distress.  HENT:  Head: Normocephalic and atraumatic.    Eyes: Pupils are equal, round, and reactive to light.  Neck: Normal range of motion. Neck supple.  Cardiovascular: Normal rate and regular rhythm.   Pulmonary/Chest: Effort normal.  Abdominal: Soft.  Neurological: She is alert.  Skin: Skin is warm and dry.    ED Course  Procedures (including critical  care time)  Labs Reviewed - No data to display No results found.   1. Cellulitis       MDM  In comparison to patient's visit from yesterday her symptoms off and clinical findings are not worse. I have given her IV clindamycin in the ER today and replaced her antibiotics with Keflex and Bactrim. I advised the patient to return to the emergency department in a day or 2 if her symptoms worsen. Or she can followup with her primary care doctor. Patient is afebrile.  Pt has been advised of the symptoms that warrant their return to the ED. Patient has voiced understanding and has agreed to follow-up with the PCP or  specialist.        Dorthula Matas, PA 10/28/11 [phone]

## 2011-11-20 ENCOUNTER — Emergency Department (HOSPITAL_COMMUNITY)
Admission: EM | Admit: 2011-11-20 | Discharge: 2011-11-20 | Disposition: A | Discharge status: Home / Self Care | Payer: Self-pay | Attending: Emergency Medicine | Admitting: Emergency Medicine

## 2011-11-20 DIAGNOSIS — K089 Disorder of teeth and supporting structures, unspecified: Secondary | ICD-10-CM | POA: Insufficient documentation

## 2011-11-20 DIAGNOSIS — K0889 Other specified disorders of teeth and supporting structures: Secondary | ICD-10-CM

## 2011-11-20 DIAGNOSIS — F17200 Nicotine dependence, unspecified, uncomplicated: Secondary | ICD-10-CM | POA: Insufficient documentation

## 2011-11-20 DIAGNOSIS — I10 Essential (primary) hypertension: Secondary | ICD-10-CM | POA: Insufficient documentation

## 2011-11-20 MED ORDER — OXYCODONE-ACETAMINOPHEN 5-325 MG PO TABS
2.0000 | ORAL_TABLET | Freq: Once | ORAL | Status: AC
  Administered 2011-11-20: 2 via ORAL
  Filled 2011-11-20: qty 2

## 2011-11-20 MED ORDER — OXYCODONE-ACETAMINOPHEN 5-325 MG PO TABS: 1.0000 | ORAL_TABLET | Freq: Four times a day (QID) | ORAL | Status: DC | PRN

## 2011-11-20 MED ORDER — PENICILLIN V POTASSIUM 500 MG PO TABS: 500.0000 mg | ORAL_TABLET | Freq: Four times a day (QID) | ORAL | Status: AC

## 2011-11-20 NOTE — ED Provider Notes (Signed)
History     CSN: [id number]  Arrival date & time 11/20/11  1554   First MD Initiated Contact with Patient 11/20/11 1709      Chief Complaint  Patient presents with  . Oral Swelling  . dental abscess     (Consider location/radiation/quality/duration/timing/severity/associated sxs/prior treatment) HPI Comments: Patient comes in today with a chief complaint of dental pain.  She reports that she has had a recurrent dental abscess of her right upper front tooth.  She was treated for this in the ED three weeks ago.  At that time the abscess had progressed to a facial cellulitis.  She does not feel that the abscess is to that point at this time.  No facial swelling or erythema at this time.  She states that her abscess did resolve, but she then started having pain again last evening.    Patient is a 46 y.o. female presenting with tooth pain. The history is provided by the patient.  Dental PainThe primary symptoms include mouth pain. Primary symptoms do not include dental injury, oral bleeding or fever. The symptoms are worsening. The symptoms occur constantly.  Additional symptoms include: gum swelling and gum tenderness. Additional symptoms do not include: dental sensitivity to temperature, purulent gums, trismus, facial swelling, trouble swallowing, pain with swallowing and drooling.    Past Medical History  Diagnosis Date  . Hypertension     Past Surgical History  Procedure Date  . Cesarean section   . Cholecystectomy   . Heel spur surgery   . Ankle fracture surgery   . Abdominal hysterectomy     No family history on file.  History  Substance Use Topics  . Smoking status: Current Every Day Smoker    Types: Cigarettes  . Smokeless tobacco: Not on file  . Alcohol Use: No    OB History    Grav Para Term Preterm Abortions TAB SAB Ect Mult Living                  Review of Systems  Constitutional: Negative for fever and chills.  HENT: Positive for dental problem.  Negative for facial swelling, drooling, trouble swallowing, neck pain and neck stiffness.   Gastrointestinal: Negative for nausea and vomiting.  Skin: Negative for color change.    Allergies  Review of patient's allergies indicates no known allergies.  Home Medications   Current Outpatient Rx  Name Route Sig Dispense Refill  . SULFAMETHOXAZOLE-TMP DS 800-160 MG PO TABS Oral Take 1 tablet by mouth daily.      BP 114/84  Pulse 85  Temp 98 F (36.7 C) (Oral)  Resp 16  SpO2 100%  Physical Exam  Nursing note and vitals reviewed. Constitutional: She is oriented to person, place, and time. She appears well-developed and well-nourished. No distress.  HENT:  Head: Normocephalic and atraumatic. No trismus in the jaw.  Mouth/Throat: Uvula is midline, oropharynx is clear and moist and mucous membranes are normal. Abnormal dentition. No dental abscesses or uvula swelling. No oropharyngeal exudate, posterior oropharyngeal edema, posterior oropharyngeal erythema or tonsillar abscesses.       Poor dental hygiene. Pt able to open and close mouth with out difficulty. Airway intact. Uvula midline. Mild gingival swelling with tenderness over left upper third tooth, but no fluctuance. No swelling or tenderness of submental and submandibular regions.  Neck: Normal range of motion and full passive range of motion without pain. Neck supple.  Cardiovascular: Normal rate and regular rhythm.   Pulmonary/Chest: Effort normal and  breath sounds normal.  Lymphadenopathy:       Head (right side): No submental, no submandibular, no tonsillar, no preauricular and no posterior auricular adenopathy present.       Head (left side): No submental, no submandibular, no tonsillar, no preauricular and no posterior auricular adenopathy present.    She has no cervical adenopathy.  Neurological: She is alert and oriented to person, place, and time.  Skin: Skin is warm and dry. No rash noted. She is not diaphoretic. No  erythema.  Psychiatric: She has a normal mood and affect.    ED Course  Procedures (including critical care time)  Labs Reviewed - No data to display No results found.   No diagnosis found.    MDM  Patient with toothache.  No gross abscess.  Exam unconcerning for Ludwig's angina or spread of infection.  Will treat with penicillin and pain medicine.  Urged patient to follow-up with dentist.          Pascal Lux [place], PA-C 11/21/11 [phone]
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
## 2011-11-21 NOTE — ED Provider Notes (Signed)
Medical screening examination/treatment/procedure(s) were performed by non-physician practitioner and as supervising physician I was immediately available for consultation/collaboration.    Robert L Beaton, MD 11/21/11 0745 

## 2012-03-27 ENCOUNTER — Emergency Department (HOSPITAL_COMMUNITY): Payer: Self-pay

## 2012-03-27 ENCOUNTER — Emergency Department (HOSPITAL_COMMUNITY)
Admission: EM | Admit: 2012-03-27 | Discharge: 2012-03-27 | Disposition: A | Discharge status: Home / Self Care | Payer: Self-pay | Attending: Emergency Medicine | Admitting: Emergency Medicine

## 2012-03-27 DIAGNOSIS — S0285XA Fracture of orbit, unspecified, initial encounter for closed fracture: Secondary | ICD-10-CM

## 2012-03-27 DIAGNOSIS — S022XXA Fracture of nasal bones, initial encounter for closed fracture: Secondary | ICD-10-CM | POA: Insufficient documentation

## 2012-03-27 DIAGNOSIS — T7492XA Unspecified child maltreatment, confirmed, initial encounter: Secondary | ICD-10-CM | POA: Insufficient documentation

## 2012-03-27 DIAGNOSIS — T7411XA Adult physical abuse, confirmed, initial encounter: Secondary | ICD-10-CM | POA: Insufficient documentation

## 2012-03-27 DIAGNOSIS — F1010 Alcohol abuse, uncomplicated: Secondary | ICD-10-CM | POA: Insufficient documentation

## 2012-03-27 DIAGNOSIS — Z0489 Encounter for examination and observation for other specified reasons: Secondary | ICD-10-CM | POA: Insufficient documentation

## 2012-03-27 DIAGNOSIS — IMO0002 Reserved for concepts with insufficient information to code with codable children: Secondary | ICD-10-CM

## 2012-03-27 DIAGNOSIS — F17200 Nicotine dependence, unspecified, uncomplicated: Secondary | ICD-10-CM | POA: Insufficient documentation

## 2012-03-27 DIAGNOSIS — I10 Essential (primary) hypertension: Secondary | ICD-10-CM | POA: Insufficient documentation

## 2012-03-27 DIAGNOSIS — S0280XA Fracture of other specified skull and facial bones, unspecified side, initial encounter for closed fracture: Secondary | ICD-10-CM | POA: Insufficient documentation

## 2012-03-27 DIAGNOSIS — T7491XA Unspecified adult maltreatment, confirmed, initial encounter: Secondary | ICD-10-CM | POA: Insufficient documentation

## 2012-03-27 LAB — COMPREHENSIVE METABOLIC PANEL
ALT: 17 U/L (ref 0–35)
AST: 21 U/L (ref 0–37)
Albumin: 3.7 g/dL (ref 3.5–5.2)
Alkaline Phosphatase: 90 U/L (ref 39–117)
BUN: 13 mg/dL (ref 6–23)
CO2: 23 mEq/L (ref 19–32)
Calcium: 8.8 mg/dL (ref 8.4–10.5)
Chloride: 102 mEq/L (ref 96–112)
Creatinine, Ser: 1.07 mg/dL (ref 0.50–1.10)
GFR calc Af Amer: 72 mL/min — ABNORMAL LOW (ref >90)
GFR calc non Af Amer: 62 mL/min — ABNORMAL LOW (ref >90)
Glucose, Bld: 100 mg/dL — ABNORMAL HIGH (ref 70–99)
Potassium: 3.7 mEq/L (ref 3.5–5.1)
Sodium: 139 mEq/L (ref 135–145)
Total Bilirubin: 0.2 mg/dL — ABNORMAL LOW (ref 0.3–1.2)
Total Protein: 7.4 g/dL (ref 6.0–8.3)

## 2012-03-27 LAB — CBC WITH DIFFERENTIAL/PLATELET
Basophils Absolute: 0 10*3/uL (ref 0.0–0.1)
Basophils Relative: 0 % (ref 0–1)
Eosinophils Absolute: 0.1 10*3/uL (ref 0.0–0.7)
Eosinophils Relative: 1 % (ref 0–5)
HCT: 36.1 % (ref 36.0–46.0)
Hemoglobin: 12.6 g/dL (ref 12.0–15.0)
Lymphocytes Relative: 26 % (ref 12–46)
Lymphs Abs: 2.5 10*3/uL (ref 0.7–4.0)
MCH: 32.5 pg (ref 26.0–34.0)
MCHC: 34.9 g/dL (ref 30.0–36.0)
MCV: 93 fL (ref 78.0–100.0)
Monocytes Absolute: 0.5 10*3/uL (ref 0.1–1.0)
Monocytes Relative: 5 % (ref 3–12)
Neutro Abs: 6.8 10*3/uL (ref 1.7–7.7)
Neutrophils Relative %: 68 % (ref 43–77)
Platelets: 206 10*3/uL (ref 150–400)
RBC: 3.88 MIL/uL (ref 3.87–5.11)
RDW: 12.7 % (ref 11.5–15.5)
WBC: 9.9 10*3/uL (ref 4.0–10.5)

## 2012-03-27 LAB — ETHANOL: Alcohol, Ethyl (B): 108 mg/dL — ABNORMAL HIGH (ref 0–11)

## 2012-03-27 LAB — ACETAMINOPHEN LEVEL: Acetaminophen (Tylenol), Serum: <15 ug/mL (ref 10–30)

## 2012-03-27 LAB — SALICYLATE LEVEL: Salicylate Lvl: <2 mg/dL — ABNORMAL LOW (ref 2.8–20.0)

## 2012-03-27 MED ORDER — AMOXICILLIN-POT CLAVULANATE 875-125 MG PO TABS: 1.0000 | ORAL_TABLET | Freq: Once | ORAL | Status: DC

## 2012-03-27 MED ORDER — HYDROCODONE-ACETAMINOPHEN 5-325 MG PO TABS: 2.0000 | ORAL_TABLET | ORAL | Status: DC | PRN

## 2012-03-27 MED ORDER — HYDROCODONE-ACETAMINOPHEN 5-325 MG PO TABS: 1.0000 | ORAL_TABLET | ORAL | Status: DC | PRN

## 2012-03-27 MED ORDER — AMOXICILLIN-POT CLAVULANATE 875-125 MG PO TABS
1.0000 | ORAL_TABLET | Freq: Once | ORAL | Status: AC
  Administered 2012-03-27: 1 via ORAL
  Filled 2012-03-27: qty 1

## 2012-03-27 MED ORDER — HYDROCODONE-ACETAMINOPHEN 5-325 MG PO TABS
2.0000 | ORAL_TABLET | Freq: Once | ORAL | Status: AC
  Administered 2012-03-27: 2 via ORAL
  Filled 2012-03-27: qty 2

## 2012-03-27 NOTE — ED Notes (Signed)
Pt refusing blood draw.  Per Koleen Distance, we are to hold off on blood draw for now

## 2012-03-27 NOTE — ED Notes (Signed)
Patient unable to void at this time
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
## 2012-03-27 NOTE — ED Notes (Signed)
Social woker is her.
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
## 2012-03-31 NOTE — ED Provider Notes (Signed)
  Medical screening examination/treatment/procedure(s) were performed by non-physician practitioner and as supervising physician I was immediately available for consultation/collaboration.    Vida Roller, MD 03/31/12 [phone]

## 2012-09-25 ENCOUNTER — Emergency Department (HOSPITAL_COMMUNITY)
Admission: EM | Admit: 2012-09-25 | Discharge: 2012-09-25 | Disposition: A | Discharge status: Home / Self Care | Payer: Self-pay | Attending: Emergency Medicine | Admitting: Emergency Medicine

## 2012-09-25 ENCOUNTER — Encounter (HOSPITAL_COMMUNITY): Payer: Self-pay | Admitting: Emergency Medicine

## 2012-09-25 DIAGNOSIS — L0291 Cutaneous abscess, unspecified: Secondary | ICD-10-CM

## 2012-09-25 DIAGNOSIS — Z79899 Other long term (current) drug therapy: Secondary | ICD-10-CM | POA: Insufficient documentation

## 2012-09-25 DIAGNOSIS — I10 Essential (primary) hypertension: Secondary | ICD-10-CM | POA: Insufficient documentation

## 2012-09-25 DIAGNOSIS — N760 Acute vaginitis: Secondary | ICD-10-CM | POA: Insufficient documentation

## 2012-09-25 DIAGNOSIS — F17200 Nicotine dependence, unspecified, uncomplicated: Secondary | ICD-10-CM | POA: Insufficient documentation

## 2012-09-25 DIAGNOSIS — R509 Fever, unspecified: Secondary | ICD-10-CM | POA: Insufficient documentation

## 2012-09-25 DIAGNOSIS — IMO0001 Reserved for inherently not codable concepts without codable children: Secondary | ICD-10-CM | POA: Insufficient documentation

## 2012-09-25 DIAGNOSIS — R21 Rash and other nonspecific skin eruption: Secondary | ICD-10-CM | POA: Insufficient documentation

## 2012-09-25 DIAGNOSIS — L03119 Cellulitis of unspecified part of limb: Secondary | ICD-10-CM | POA: Insufficient documentation

## 2012-09-25 DIAGNOSIS — L02419 Cutaneous abscess of limb, unspecified: Secondary | ICD-10-CM | POA: Insufficient documentation

## 2012-09-25 MED ORDER — CLINDAMYCIN HCL 150 MG PO CAPS: 300.0000 mg | ORAL_CAPSULE | Freq: Three times a day (TID) | ORAL | Status: DC

## 2012-09-25 MED ORDER — OXYCODONE-ACETAMINOPHEN 5-325 MG PO TABS: 2.0000 | ORAL_TABLET | ORAL | Status: DC | PRN

## 2012-09-25 MED ORDER — CLINDAMYCIN HCL 150 MG PO CAPS
300.0000 mg | ORAL_CAPSULE | Freq: Once | ORAL | Status: AC
  Administered 2012-09-25: 300 mg via ORAL
  Filled 2012-09-25: qty 2

## 2012-09-25 NOTE — ED Provider Notes (Signed)
CSN: [id number]     Arrival date & time 09/25/12  1724 History  This chart was scribed for non-physician practitioner Arthor Captain working with Gilda Crease,  by Yevette Edwards, ED Scribe. This patient was seen in room TR11C/TR11C and the patient's care was started at 5:39 PM.   None    Chief Complaint  Patient presents with  . Recurrent Skin Infections    left leg   The history is provided by the patient. No language interpreter was used.   HPI Comments:  Claire [name] is a 47 y.o. female who presents to the Emergency Department complaining of recurrent bumps to her upper left medial thigh and vaginal area which began approximately one month ago. The pt had also experienced similar bumps to her right thigh which spontaneously resolved. She noticed the bumps after she had shaved using her boyfriend's razor. The pt has experienced pain associated with the bumps. She reports that blood and pus have drained from several of the sites. She also reports that today she experienced chills and a suspected fever. She also has experienced joint aches. She denies experiencing any dysuria or trouble with bowel movements. Nothing the pt has attempted has alleviated the symptoms.   Past Medical History  Diagnosis Date  . Hypertension    Past Surgical History  Procedure Laterality Date  . Cesarean section    . Cholecystectomy    . Heel spur surgery    . Ankle fracture surgery    . Abdominal hysterectomy    . Arm surgery Right    No family history on file. History  Substance Use Topics  . Smoking status: Current Every Day Smoker    Types: Cigarettes  . Smokeless tobacco: Not on file  . Alcohol Use: No   No OB history provided.   Review of Systems  Constitutional: Positive for fever and chills.  Gastrointestinal: Negative for diarrhea and constipation.  Genitourinary: Negative for dysuria and difficulty urinating.  Musculoskeletal: Positive for myalgias.  Skin: Positive for rash.     Allergies  Review of patient's allergies indicates no known allergies.  Home Medications   Current Outpatient Rx  Name  Route  Sig  Dispense  Refill  . amoxicillin-clavulanate (AUGMENTIN) 875-125 MG per tablet   Oral   Take 1 tablet by mouth once.   27 tablet   0   . HYDROcodone-acetaminophen (NORCO/VICODIN) 5-325 MG per tablet   Oral   Take 2 tablets by mouth every 4 (four) hours as needed for pain.   10 tablet   0   . HYDROcodone-acetaminophen (NORCO/VICODIN) 5-325 MG per tablet   Oral   Take 1 tablet by mouth every 4 (four) hours as needed for pain.   15 tablet   0   . oxyCODONE-acetaminophen (PERCOCET/ROXICET) 5-325 MG per tablet   Oral   Take 1-2 tablets by mouth every 6 (six) hours as needed for pain.   20 tablet   0   . sulfamethoxazole-trimethoprim (BACTRIM DS) 800-160 MG per tablet   Oral   Take 1 tablet by mouth daily.          Triage Vitals: BP 144/91  Pulse 95  Temp(Src) 98.1 F (36.7 C) (Oral)  Resp 18  Ht 5\' 5"  (1.651 m)  Wt 165 lb (74.844 kg)  BMI 27.46 kg/m2  SpO2 100%  Physical Exam  Nursing note and vitals reviewed. Constitutional: She is oriented to person, place, and time. She appears well-developed and well-nourished. No distress.  HENT:  Head: Normocephalic and atraumatic.  Eyes: EOM are normal.  Neck: Neck supple. No tracheal deviation present.  Cardiovascular: Normal rate.   Pulmonary/Chest: Effort normal. No respiratory distress.  Genitourinary:  Right gluteal area, left buttocks, and perineum, the pt has areas of induration and surrounding cellulitis.  She has two areas of fluctuance and induration to the right labia. Multiple small areas of bulla just lateral to the  inguinal area.  Musculoskeletal: Normal range of motion.  Lymphadenopathy:  No lymphadenopathy   Neurological: She is alert and oriented to person, place, and time.  Skin: Skin is warm and dry.  Psychiatric: She has a normal mood and affect. Her behavior is  normal.    ED Course   DIAGNOSTIC STUDIES:  Oxygen Saturation is 100% on room air, normal by my interpretation.    COORDINATION OF CARE:  5:54 PM- Discussed treatment plan with patient which includes an antibiotic and pain medication, and the patient agreed to the plan.  Informed pt to return to the ED or a PCP for a follow-up in three days. Also advised the pt to return to the ED if her symptoms worsen in the next three days.   Procedures (including critical care time)  6:16 PM- Performed bedside ultrasound. The ultrasound showed cellulitis without visible abscess to the left gluteal region. The ulstrasound showed two small abscesses of right labia majora.   6:27 PM- INCISION AND DRAINAGE PROCEDURE NOTE: Patient identification was confirmed and verbal consent was obtained. This procedure was performed by Arthor Captain, PA-C, at 6:27 PM. Site: Right labia majora Sterile procedures observed Needle size: 25 gauage Anesthetic used (type and amt): Lidocaine 2% without epi Blade size: 11 Drainage: Minimal Complexity: Simple  Packing not used Site anesthetized, incision made over site, wound drained and explored loculations, rinsed with copious amounts of normal saline, wound Not large enough for packing, covered with dry, sterile dressing.  Pt tolerated procedure well without complications.  Instructions for care discussed verbally and pt provided with additional written instructions for homecare and f/u.   6:35 PM- INCISION AND DRAINAGE PROCEDURE NOTE: Patient identification was confirmed and verbal consent was obtained. This procedure was performed by Arthor Captain, PA-C, at 6:35 PM. Site: Right labia majora Sterile procedures observed Needle size: 25 gauage Anesthetic used (type and amt): Lidocaine 2% without epi Blade size: 11 Drainage: Minimal Complexity: Simple  Packing not used Site anesthetized, incision made over site, wound drained and explored loculations,  rinsed with copious amounts of normal saline,wound Not large enough for packing, covered with dry, sterile dressing.  Pt tolerated procedure well without complications.  Instructions for care discussed verbally and pt provided with additional written instructions for homecare and f/u.  Labs Reviewed - No data to display No results found. 1. Abscess     MDM  Patient with skin abscess amenable to incision and drainage.    Wound recheck in 2-3 days. Encouraged home warm soaks and flushing.  Mild signs of cellulitis is surrounding skin.  Will d/c to home.  No antibiotic therapy is indicated.   I personally performed the services described in this documentation, which was scribed in my presence. The recorded information has been reviewed and is accurate.     Arthor Captain, PA-C 09/25/12 1950

## 2012-09-25 NOTE — ED Notes (Signed)
The pt has had  genitalial  Eruptions for one month.  The area on her lt thigh is red and painful and larger then the others
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
## 2012-09-27 ENCOUNTER — Emergency Department (HOSPITAL_COMMUNITY)
Admission: EM | Admit: 2012-09-27 | Discharge: 2012-09-28 | Disposition: A | Discharge status: Home / Self Care | Payer: Self-pay | Attending: Emergency Medicine | Admitting: Emergency Medicine

## 2012-09-27 ENCOUNTER — Encounter (HOSPITAL_COMMUNITY): Payer: Self-pay | Admitting: Emergency Medicine

## 2012-09-27 DIAGNOSIS — L02419 Cutaneous abscess of limb, unspecified: Secondary | ICD-10-CM | POA: Insufficient documentation

## 2012-09-27 DIAGNOSIS — F17200 Nicotine dependence, unspecified, uncomplicated: Secondary | ICD-10-CM | POA: Insufficient documentation

## 2012-09-27 DIAGNOSIS — I10 Essential (primary) hypertension: Secondary | ICD-10-CM | POA: Insufficient documentation

## 2012-09-27 DIAGNOSIS — L0291 Cutaneous abscess, unspecified: Secondary | ICD-10-CM

## 2012-09-27 DIAGNOSIS — Z792 Long term (current) use of antibiotics: Secondary | ICD-10-CM | POA: Insufficient documentation

## 2012-09-27 MED ORDER — ONDANSETRON HCL 4 MG/2ML IJ SOLN
4.0000 mg | Freq: Once | INTRAMUSCULAR | Status: AC
  Administered 2012-09-27: 4 mg via INTRAVENOUS
  Filled 2012-09-27: qty 2

## 2012-09-27 MED ORDER — OXYCODONE-ACETAMINOPHEN 5-325 MG PO TABS
1.0000 | ORAL_TABLET | Freq: Once | ORAL | Status: AC
  Administered 2012-09-27: 1 via ORAL
  Filled 2012-09-27: qty 1

## 2012-09-27 MED ORDER — MORPHINE SULFATE 4 MG/ML IJ SOLN
4.0000 mg | Freq: Once | INTRAMUSCULAR | Status: AC
  Administered 2012-09-27: 4 mg via INTRAVENOUS
  Filled 2012-09-27: qty 1

## 2012-09-27 NOTE — ED Notes (Signed)
PT. REPORTS PROGRESSING ABSCESS AT LEFT INNER UPPER THIGH FOR SEVERAL DAYS WITH DRAINAGE CURRENTLY TAKING ORAL ANTIBIOTIC WITH NO IMPROVEMENT . DENIES  FEVER OR CHILLS.

## 2012-09-27 NOTE — ED Provider Notes (Signed)
Medical screening examination/treatment/procedure(s) were performed by non-physician practitioner and as supervising physician I was immediately available for consultation/collaboration.    Gilda Crease, MD 09/27/12 [phone]

## 2012-09-28 LAB — BASIC METABOLIC PANEL
BUN: 16 mg/dL (ref 6–23)
CO2: 24 mEq/L (ref 19–32)
Calcium: 9.5 mg/dL (ref 8.4–10.5)
Chloride: 104 mEq/L (ref 96–112)
Creatinine, Ser: 1.03 mg/dL (ref 0.50–1.10)
GFR calc Af Amer: 75 mL/min — ABNORMAL LOW (ref >90)
GFR calc non Af Amer: 65 mL/min — ABNORMAL LOW (ref >90)
Glucose, Bld: 84 mg/dL (ref 70–99)
Potassium: 4 mEq/L (ref 3.5–5.1)
Sodium: 139 mEq/L (ref 135–145)

## 2012-09-28 LAB — CBC WITH DIFFERENTIAL/PLATELET
Basophils Absolute: 0.1 10*3/uL (ref 0.0–0.1)
Basophils Relative: 1 % (ref 0–1)
Eosinophils Absolute: 0.3 10*3/uL (ref 0.0–0.7)
Eosinophils Relative: 4 % (ref 0–5)
HCT: 34.3 % — ABNORMAL LOW (ref 36.0–46.0)
Hemoglobin: 12.1 g/dL (ref 12.0–15.0)
Lymphocytes Relative: 30 % (ref 12–46)
Lymphs Abs: 2.7 10*3/uL (ref 0.7–4.0)
MCH: 33.3 pg (ref 26.0–34.0)
MCHC: 35.3 g/dL (ref 30.0–36.0)
MCV: 94.5 fL (ref 78.0–100.0)
Monocytes Absolute: 0.6 10*3/uL (ref 0.1–1.0)
Monocytes Relative: 7 % (ref 3–12)
Neutro Abs: 5.5 10*3/uL (ref 1.7–7.7)
Neutrophils Relative %: 60 % (ref 43–77)
Platelets: 179 10*3/uL (ref 150–400)
RBC: 3.63 MIL/uL — ABNORMAL LOW (ref 3.87–5.11)
RDW: 12.2 % (ref 11.5–15.5)
WBC: 9.1 10*3/uL (ref 4.0–10.5)

## 2012-09-28 MED ORDER — CEPHALEXIN 250 MG PO CAPS
500.0000 mg | ORAL_CAPSULE | Freq: Once | ORAL | Status: AC
  Administered 2012-09-28: 500 mg via ORAL
  Filled 2012-09-28: qty 2

## 2012-09-28 MED ORDER — MORPHINE SULFATE 4 MG/ML IJ SOLN
4.0000 mg | Freq: Once | INTRAMUSCULAR | Status: AC
  Administered 2012-09-28: 4 mg via INTRAVENOUS
  Filled 2012-09-28: qty 1

## 2012-09-28 MED ORDER — CEPHALEXIN 500 MG PO CAPS: 500.0000 mg | ORAL_CAPSULE | Freq: Four times a day (QID) | ORAL | Status: DC

## 2012-09-28 MED ORDER — OXYCODONE-ACETAMINOPHEN 5-325 MG PO TABS: ORAL_TABLET | ORAL | Status: DC

## 2012-09-28 NOTE — ED Provider Notes (Signed)
CSN: [id number]     Arrival date & time 09/27/12  1850 History     First MD Initiated Contact with Patient 09/27/12 2257     Chief Complaint  Patient presents with  . Abscess   (Consider location/radiation/quality/duration/timing/severity/associated sxs/prior Treatment) HPI  Claire [name] is a 47 y.o. female complaining of worsening abscess to left inner thigh. Patient has been taking clindamycin for 48 hours. She was seen and the incision and drainage was performed on the right inner thigh 2 days ago. Patient denies fever, nausea vomiting. She states her pain is severe, 8/10, exacerbated by movement and palpation.  Past Medical History  Diagnosis Date  . Hypertension    Past Surgical History  Procedure Laterality Date  . Cesarean section    . Cholecystectomy    . Heel spur surgery    . Ankle fracture surgery    . Abdominal hysterectomy    . Arm surgery Right    No family history on file. History  Substance Use Topics  . Smoking status: Current Every Day Smoker    Types: Cigarettes  . Smokeless tobacco: Not on file  . Alcohol Use: No   OB History   Grav Para Term Preterm Abortions TAB SAB Ect Mult Living                 Review of Systems 10 systems reviewed and found to be negative, except as noted in the HPI   Allergies  Review of patient's allergies indicates no known allergies.  Home Medications   Current Outpatient Rx  Name  Route  Sig  Dispense  Refill  . clindamycin (CLEOCIN) 150 MG capsule   Oral   Take 300 mg by mouth 3 (three) times daily.         Marland Kitchen oxyCODONE-acetaminophen (PERCOCET/ROXICET) 5-325 MG per tablet   Oral   Take 1 tablet by mouth every 4 (four) hours as needed for pain.          BP 128/87  Pulse 75  Temp(Src) 96.3 F (35.7 C) (Oral)  Resp 16  SpO2 100% Physical Exam  Nursing note and vitals reviewed. Constitutional: She is oriented to person, place, and time. She appears well-developed and well-nourished. No distress.  HENT:   Head: Normocephalic.  Eyes: Conjunctivae and EOM are normal.  Cardiovascular: Normal rate.   Pulmonary/Chest: Effort normal and breath sounds normal. No stridor.  Musculoskeletal: Normal range of motion.  Neurological: She is alert and oriented to person, place, and time.  Skin:  By 6 cm indurated abscess to left medial proximal thigh, no fluctuance.  Psychiatric: She has a normal mood and affect.    ED Course   Procedures (including critical care time)  INCISION AND DRAINAGE Performed by: Wynetta Emery Consent: Verbal consent obtained. Risks and benefits: risks, benefits and alternatives were discussed Type: abscess  Body area: left thigh  Anesthesia: local infiltration  Incision was made with a scalpel.  Local anesthetic: lidocaine 2% with epinephrine  Anesthetic total:5 ml  Complexity: complex Blunt dissection to break up loculations  Drainage: purulent  Drainage amount: scant  Packing material: none  Patient tolerance: Patient tolerated the procedure well with no immediate complications.     Labs Reviewed  CBC WITH DIFFERENTIAL - Abnormal; Notable for the following:    RBC 3.63 (*)    HCT 34.3 (*)    All other components within normal limits  BASIC METABOLIC PANEL - Abnormal; Notable for the following:    GFR calc non  Af Amer 65 (*)    GFR calc Af Amer 75 (*)    All other components within normal limits   No results found. 1. Abscess     MDM   Filed Vitals:   09/27/12 1855 09/27/12 2053  BP: 135/96 128/87  Pulse: 88 75  Temp: 97.6 F (36.4 C) 96.3 F (35.7 C)  TempSrc: Oral Oral  Resp: 18 16  SpO2: 97% 100%     Claire [name] is a 47 y.o. female with abscess left thigh. She has been on clindamycin, did not receive incision and drainage for the abscess she presents for today. For this reason I don't consider this an outpatient failure. Incision and drainage yielded very scant amount of purulent material. I will start her on Keflex to  cover for cellulitis. Extensive return precautions discussed.  Medications  oxyCODONE-acetaminophen (PERCOCET/ROXICET) 5-325 MG per tablet 1 tablet (1 tablet Oral Given 09/27/12 2052)  morphine 4 MG/ML injection 4 mg (4 mg Intravenous Given 09/27/12 2318)  ondansetron (ZOFRAN) injection 4 mg (4 mg Intravenous Given 09/27/12 2318)  morphine 4 MG/ML injection 4 mg (4 mg Intravenous Given 09/28/12 0037)  cephALEXin (KEFLEX) capsule 500 mg (500 mg Oral Given 09/28/12 0059)    Pt is hemodynamically stable, appropriate for, and amenable to discharge at this time. Pt verbalized understanding and agrees with care plan. All questions answered. Outpatient follow-up and specific return precautions discussed.    Discharge Medication List as of 09/28/2012 12:54 AM    START taking these medications   Details  cephALEXin (KEFLEX) 500 MG capsule Take 1 capsule (500 mg total) by mouth 4 (four) times daily., Starting 09/28/2012, Until Discontinued, Print    !! oxyCODONE-acetaminophen (PERCOCET/ROXICET) 5-325 MG per tablet 1 to 2 tabs PO q6hrs  PRN for pain, Print     !! - Potential duplicate medications found. Please discuss with provider.      Note: Portions of this report may have been transcribed using voice recognition software. Every effort was made to ensure accuracy; however, inadvertent computerized transcription errors may be present    Wynetta Emery, PA-C 09/28/12 0157

## 2012-10-01 NOTE — ED Provider Notes (Signed)
Medical screening examination/treatment/procedure(s) were performed by non-physician practitioner and as supervising physician I was immediately available for consultation/collaboration.   Shelda Jakes, MD 10/01/12 [phone]

## 2012-10-29 ENCOUNTER — Emergency Department (HOSPITAL_COMMUNITY)
Admission: EM | Admit: 2012-10-29 | Discharge: 2012-10-29 | Disposition: A | Discharge status: Home / Self Care | Payer: Self-pay | Attending: Emergency Medicine | Admitting: Emergency Medicine

## 2012-10-29 ENCOUNTER — Encounter (HOSPITAL_COMMUNITY): Payer: Self-pay | Admitting: *Deleted

## 2012-10-29 ENCOUNTER — Emergency Department (HOSPITAL_COMMUNITY): Payer: Self-pay

## 2012-10-29 DIAGNOSIS — F17200 Nicotine dependence, unspecified, uncomplicated: Secondary | ICD-10-CM | POA: Insufficient documentation

## 2012-10-29 DIAGNOSIS — S8990XA Unspecified injury of unspecified lower leg, initial encounter: Secondary | ICD-10-CM | POA: Insufficient documentation

## 2012-10-29 DIAGNOSIS — Y9269 Other specified industrial and construction area as the place of occurrence of the external cause: Secondary | ICD-10-CM | POA: Insufficient documentation

## 2012-10-29 DIAGNOSIS — Z791 Long term (current) use of non-steroidal anti-inflammatories (NSAID): Secondary | ICD-10-CM | POA: Insufficient documentation

## 2012-10-29 DIAGNOSIS — G8929 Other chronic pain: Secondary | ICD-10-CM | POA: Insufficient documentation

## 2012-10-29 DIAGNOSIS — R0789 Other chest pain: Secondary | ICD-10-CM | POA: Insufficient documentation

## 2012-10-29 DIAGNOSIS — Z79899 Other long term (current) drug therapy: Secondary | ICD-10-CM | POA: Insufficient documentation

## 2012-10-29 DIAGNOSIS — Y990 Civilian activity done for income or pay: Secondary | ICD-10-CM | POA: Insufficient documentation

## 2012-10-29 DIAGNOSIS — Y9389 Activity, other specified: Secondary | ICD-10-CM | POA: Insufficient documentation

## 2012-10-29 DIAGNOSIS — W010XXA Fall on same level from slipping, tripping and stumbling without subsequent striking against object, initial encounter: Secondary | ICD-10-CM | POA: Insufficient documentation

## 2012-10-29 DIAGNOSIS — I10 Essential (primary) hypertension: Secondary | ICD-10-CM | POA: Insufficient documentation

## 2012-10-29 MED ORDER — KETOROLAC TROMETHAMINE 60 MG/2ML IM SOLN
60.0000 mg | Freq: Once | INTRAMUSCULAR | Status: DC
  Filled 2012-10-29: qty 2

## 2012-10-29 MED ORDER — NAPROXEN 375 MG PO TABS: 375.0000 mg | ORAL_TABLET | Freq: Two times a day (BID) | ORAL | Status: DC

## 2012-10-29 MED ORDER — OMEPRAZOLE 20 MG PO CPDR: 20.0000 mg | DELAYED_RELEASE_CAPSULE | Freq: Every day | ORAL | Status: DC

## 2012-10-29 MED ORDER — OXYCODONE-ACETAMINOPHEN 5-325 MG PO TABS
1.0000 | ORAL_TABLET | Freq: Once | ORAL | Status: AC
  Administered 2012-10-29: 1 via ORAL
  Filled 2012-10-29: qty 1

## 2012-10-29 NOTE — ED Notes (Signed)
Alert, NAD, calm, interactive, wheeling self in w/c.

## 2012-10-29 NOTE — ED Notes (Signed)
Patient becoming agitated.  States her chest only started hurting because we left her in the waiting room in pain for so long.  Patient refused EKG and states she only wants her knee addressed.  Apologized to patient for extended wait time.

## 2012-10-29 NOTE — ED Notes (Signed)
Pt in stating she fell at work, states she landed on her right knee which she has a history of problems with, states she was supposed to have surgery on this knee a few years ago but wasn't able to, pain has increased since fall, pt was able to ambulate with assistance after fall
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
## 2012-10-29 NOTE — ED Notes (Signed)
Updated. Plan, process and wait explained.

## 2012-10-30 ENCOUNTER — Emergency Department (HOSPITAL_COMMUNITY)
Admission: EM | Admit: 2012-10-30 | Discharge: 2012-10-30 | Disposition: A | Discharge status: Home / Self Care | Payer: Self-pay | Attending: Emergency Medicine | Admitting: Emergency Medicine

## 2012-10-30 ENCOUNTER — Encounter (HOSPITAL_COMMUNITY): Payer: Self-pay

## 2012-10-30 DIAGNOSIS — K089 Disorder of teeth and supporting structures, unspecified: Secondary | ICD-10-CM | POA: Insufficient documentation

## 2012-10-30 DIAGNOSIS — I10 Essential (primary) hypertension: Secondary | ICD-10-CM | POA: Insufficient documentation

## 2012-10-30 DIAGNOSIS — Z9889 Other specified postprocedural states: Secondary | ICD-10-CM | POA: Insufficient documentation

## 2012-10-30 DIAGNOSIS — F17200 Nicotine dependence, unspecified, uncomplicated: Secondary | ICD-10-CM | POA: Insufficient documentation

## 2012-10-30 DIAGNOSIS — K0889 Other specified disorders of teeth and supporting structures: Secondary | ICD-10-CM

## 2012-10-30 MED ORDER — PENICILLIN V POTASSIUM 500 MG PO TABS: 500.0000 mg | ORAL_TABLET | Freq: Four times a day (QID) | ORAL | Status: DC

## 2012-10-30 MED ORDER — TRAMADOL HCL 50 MG PO TABS: 50.0000 mg | ORAL_TABLET | Freq: Four times a day (QID) | ORAL | Status: DC | PRN

## 2012-10-30 NOTE — ED Provider Notes (Signed)
CSN: [id number]     Arrival date & time 10/30/12  1549 History  This chart was scribed for non-physician practitioner Junious Silk, working with Celene Kras, MD by Carl Best, ED Scribe. This patient was seen in room WTR6/WTR6 and the patient's care was started at 4:20 PM.     Chief Complaint  Patient presents with  . Dental Pain    Patient is a 47 y.o. female presenting with tooth pain. The history is provided by the patient. No language interpreter was used.  Dental Pain Associated symptoms: no fever and no neck pain    HPI Comments: Delayne Sanzo is a 47 y.o. female who presents to the Emergency Department complaining of constant, sharp pain to her upper, left canine that radiates to her jaw and face.  Nothing makes her pain better. She is sensitive to heat and cold. She stated that she had a root canal performed a couple of months ago but was unfinished due to insurance complications.  Patient denies fever as an associated symptom.  Patient was falling asleep during the exam and had difficulty speaking.     Past Medical History  Diagnosis Date  . Hypertension    Past Surgical History  Procedure Laterality Date  . Cesarean section    . Cholecystectomy    . Heel spur surgery    . Ankle fracture surgery    . Abdominal hysterectomy    . Arm surgery Right    No family history on file. History  Substance Use Topics  . Smoking status: Current Every Day Smoker    Types: Cigarettes  . Smokeless tobacco: Not on file  . Alcohol Use: No   OB History   Grav Para Term Preterm Abortions TAB SAB Ect Mult Living                 Review of Systems  Constitutional: Negative for fever and chills.  HENT: Positive for dental problem. Negative for ear pain and neck pain.   Respiratory: Negative for shortness of breath.   Cardiovascular: Negative for chest pain.  Gastrointestinal: Negative for nausea, vomiting and abdominal pain.  All other systems reviewed and are  negative.    Allergies  Toradol  Home Medications   Current Outpatient Rx  Name  Route  Sig  Dispense  Refill  . acetaminophen (TYLENOL) 500 MG tablet   Oral   Take 500 mg by mouth every 6 (six) hours as needed for pain.         Marland Kitchen penicillin v potassium (VEETID) 500 MG tablet   Oral   Take 1 tablet (500 mg total) by mouth 4 (four) times daily.   40 tablet   0   . traMADol (ULTRAM) 50 MG tablet   Oral   Take 1 tablet (50 mg total) by mouth every 6 (six) hours as needed for pain.   15 tablet   0    Triage Vitals: BP 95/67  Pulse 90  Temp(Src) 98.2 F (36.8 C) (Oral)  Resp 16  Ht 5\' 5"  (1.651 m)  Wt 160 lb (72.576 kg)  BMI 26.63 kg/m2  SpO2 98%  Physical Exam  Nursing note and vitals reviewed. Constitutional: She is oriented to person, place, and time. She appears well-developed and well-nourished. No distress.  Patient was sleeping during exam and did not wake up while palpating sore tooth.    HENT:  Head: Normocephalic and atraumatic.  Right Ear: External ear normal.  Left Ear: External ear normal.  Nose: Nose normal.  Mouth/Throat: Oropharynx is clear and moist.  No trismus, submental edema, or tongue elevation  Eyes: Conjunctivae are normal.  Neck: Normal range of motion.  Cardiovascular: Normal rate, regular rhythm and normal heart sounds.   Pulmonary/Chest: Effort normal and breath sounds normal. No stridor. No respiratory distress. She has no wheezes. She has no rales.  Abdominal: Soft. She exhibits no distension.  Musculoskeletal: Normal range of motion.  Neurological: She is alert and oriented to person, place, and time. She has normal strength.  Skin: Skin is warm and dry. She is not diaphoretic. No erythema.  Psychiatric: She has a normal mood and affect. Her behavior is normal.    ED Course  Procedures (including critical care time)  DIAGNOSTIC STUDIES: Oxygen Saturation is 98% on room air, normal by my interpretation.    COORDINATION OF  CARE: 4:23 PM - Discussed performing a dental block on patient but patient refused procedure.  Advised patient to f/o with a dentist within the next two days and discussed prescribing antibiotics and pain medication for pain and infection and patient agreed to treatment plan.  Labs Review Labs Reviewed - No data to display Imaging Review Dg Knee Complete 4 Views Right  10/29/2012   *RADIOLOGY REPORT*  Clinical Data: Knee injury, slipped and fell today in puddle of water, pain  RIGHT KNEE - COMPLETE 4+ VIEW  Comparison: 08/26/2009  Findings: Mild medial compartment space narrowing. Osseous mineralization grossly normal. No acute fracture, dislocation, or bone destruction. No knee joint effusion seen. Clothing artifacts at distal thigh.  IMPRESSION: Minimal degenerative changes. No acute abnormalities.   Original Report Authenticated By: Ulyses Southward, M.D.    MDM   1. Pain, dental    Patient with toothache.  No gross abscess.  Exam unconcerning for Ludwig's angina or spread of infection.  Will treat with penicillin and pain medicine.  Urged patient to follow-up with dentist.  Patient left prior to receiving scripts. Her behavior was likely drug seeking. Despite being unable to stay awake during exam she requested pain medication. Unhappy that she was not going to receive percocet.   I personally performed the services described in this documentation, which was scribed in my presence. The recorded information has been reviewed and is accurate.     Mora Bellman, PA-C 10/30/12 2101

## 2012-10-30 NOTE — ED Provider Notes (Signed)
Medical screening examination/treatment/procedure(s) were performed by non-physician practitioner and as supervising physician I was immediately available for consultation/collaboration.  Dione Booze, MD 10/30/12 [phone]

## 2012-10-30 NOTE — ED Notes (Signed)
Pt c/o of dental pain x 2 weeks. Pt ? Infection to front teeth.  Pt during triage is falling asleep during exam.

## 2012-10-30 NOTE — ED Notes (Signed)
Seen at [facility] yesterday for work related injury. Did not mention dental pain. Report did not want to focus on that pain. Rates pan 10/10 today.  Pt report has been up for 4 days

## 2012-10-30 NOTE — ED Notes (Signed)
Upon entering room, pt sleeping, aroused with repeated commands to awake. Informed this pt that I had spoke with EDPA  Dahlia Client and pt was made aware EDPA would not change RX.to percocet. Pt informed this Clinical research associate to "keep it all" and left room without signing out or taking discharge papers including rx x 2..  Made EDPA and Charge Jen aware. Pt leaving AMA

## 2012-10-31 NOTE — ED Provider Notes (Signed)
Medical screening examination/treatment/procedure(s) were performed by non-physician practitioner and as supervising physician I was immediately available for consultation/collaboration.    Jon R Knapp, MD 10/31/12 0026 

## 2013-06-26 ENCOUNTER — Emergency Department (HOSPITAL_COMMUNITY): Payer: Self-pay

## 2013-06-26 ENCOUNTER — Emergency Department (HOSPITAL_COMMUNITY)
Admission: EM | Admit: 2013-06-26 | Discharge: 2013-06-26 | Disposition: A | Discharge status: Home / Self Care | Payer: Self-pay | Attending: Emergency Medicine | Admitting: Emergency Medicine

## 2013-06-26 ENCOUNTER — Encounter (HOSPITAL_COMMUNITY): Payer: Self-pay | Admitting: Emergency Medicine

## 2013-06-26 DIAGNOSIS — R296 Repeated falls: Secondary | ICD-10-CM | POA: Insufficient documentation

## 2013-06-26 DIAGNOSIS — I10 Essential (primary) hypertension: Secondary | ICD-10-CM | POA: Insufficient documentation

## 2013-06-26 DIAGNOSIS — S92353A Displaced fracture of fifth metatarsal bone, unspecified foot, initial encounter for closed fracture: Secondary | ICD-10-CM

## 2013-06-26 DIAGNOSIS — S82899A Other fracture of unspecified lower leg, initial encounter for closed fracture: Secondary | ICD-10-CM | POA: Insufficient documentation

## 2013-06-26 DIAGNOSIS — Y9389 Activity, other specified: Secondary | ICD-10-CM | POA: Insufficient documentation

## 2013-06-26 DIAGNOSIS — S82839A Other fracture of upper and lower end of unspecified fibula, initial encounter for closed fracture: Secondary | ICD-10-CM

## 2013-06-26 DIAGNOSIS — Y929 Unspecified place or not applicable: Secondary | ICD-10-CM | POA: Insufficient documentation

## 2013-06-26 DIAGNOSIS — Z792 Long term (current) use of antibiotics: Secondary | ICD-10-CM | POA: Insufficient documentation

## 2013-06-26 DIAGNOSIS — S92309A Fracture of unspecified metatarsal bone(s), unspecified foot, initial encounter for closed fracture: Secondary | ICD-10-CM | POA: Insufficient documentation

## 2013-06-26 DIAGNOSIS — X500XXA Overexertion from strenuous movement or load, initial encounter: Secondary | ICD-10-CM | POA: Insufficient documentation

## 2013-06-26 DIAGNOSIS — F17200 Nicotine dependence, unspecified, uncomplicated: Secondary | ICD-10-CM | POA: Insufficient documentation

## 2013-06-26 MED ORDER — OXYCODONE-ACETAMINOPHEN 5-325 MG PO TABS
1.0000 | ORAL_TABLET | ORAL | Status: DC | PRN
Start: 2013-06-26 — End: 2015-10-23

## 2013-06-26 MED ORDER — HYDROCODONE-ACETAMINOPHEN 5-325 MG PO TABS
2.0000 | ORAL_TABLET | Freq: Once | ORAL | Status: AC
  Administered 2013-06-26: 2 via ORAL
  Filled 2013-06-26: qty 2

## 2013-06-26 NOTE — ED Provider Notes (Signed)
CSN: [id number]     Arrival date & time 06/26/13  1022 History   This chart was scribed for non-physician practitioner working Derwood KaplanAnkit Nanavati, MD by Jarvis Morganaylor Ferguson, ED Scribe. This patient was seen in room TR08C/TR08C and the patient's care was started at 11:21 AM   Chief Complaint  Patient presents with  . Ankle Injury     The history is provided by the patient. No language interpreter was used.   HPI Comments: Derinda SisBillie Fitch is a 48 y.o. female who presents to the Emergency Department complaining of an ankle injury that occurred one week ago. Patient states that she is having constant, sharp,  pain in her left foot. Patient states that the injury occurred when she stepped backwards and turned her left foot. Patient states that she fell when she turned her left foot. Patient states that the pain is radiating up her left leg. Patient states that the pain is exacerbated by movement. Patient states that she applied ice and worn an ACE bandage to help with the pain, but they provided no relief. Patient denies any swelling. Associated numbness and tingling of her toes.  Denies other injury.  Denies leg swelling, fever.     Past Medical History  Diagnosis Date  . Hypertension    Past Surgical History  Procedure Laterality Date  . Cesarean section    . Cholecystectomy    . Heel spur surgery    . Ankle fracture surgery    . Abdominal hysterectomy    . Arm surgery Right    No family history on file. History  Substance Use Topics  . Smoking status: Current Every Day Smoker    Types: Cigarettes  . Smokeless tobacco: Not on file  . Alcohol Use: No   OB History   Grav Para Term Preterm Abortions TAB SAB Ect Mult Living                 Review of Systems  Musculoskeletal:       Left foot/ankle pain  All other systems reviewed and are negative.     Allergies  Toradol  Home Medications   Prior to Admission medications   Medication Sig Start Date End Date Taking? Authorizing  Provider  acetaminophen (TYLENOL) 500 MG tablet Take 500 mg by mouth every 6 (six) hours as needed for pain.    Historical Provider, MD  penicillin v potassium (VEETID) 500 MG tablet Take 1 tablet (500 mg total) by mouth 4 (four) times daily. 10/30/12   Mora BellmanHannah S Merrell, PA-C  traMADol (ULTRAM) 50 MG tablet Take 1 tablet (50 mg total) by mouth every 6 (six) hours as needed for pain. 10/30/12   Mora BellmanHannah S Merrell, PA-C   Triage Vitals: BP 153/86  Pulse 76  Temp(Src) 97.9 F (36.6 C) (Oral)  SpO2 100%  Physical Exam  Nursing note and vitals reviewed. Constitutional: She appears well-developed and well-nourished. No distress.  HENT:  Head: Normocephalic and atraumatic.  Neck: Neck supple.  Pulmonary/Chest: Effort normal.  Musculoskeletal:       Feet:  Decreased sensation of 4th and 5th digits of left foot.  Pt does not move any of her toes.  Capillary refill < 2 seconds.  Dorsalis pedis and posterior tibialis pulses intact.    Neurological: She is alert.  Skin: She is not diaphoretic.    ED Course  Procedures (including critical care time)  DIAGNOSTIC STUDIES: Oxygen Saturation is 100% on RA, normal by my interpretation.    COORDINATION OF  CARE: 11:29 AM- Will order diagnostic imaging and pain medication. Pt advised of plan for treatment and pt agrees.  12:03 PM- Will order crutches and leg splint.  Pt advised of plan for treatment and pt agrees.  Labs Review Labs Reviewed - No data to display  Imaging Review Dg Ankle 2 Views Left  06/26/2013   CLINICAL DATA:  Injury with pain, swelling and bruising along the left ankle, laterally.  EXAM: LEFT ANKLE - 2 VIEW  COMPARISON:  DG FOOT 2 VIEWS*L* dated 06/26/2013  FINDINGS: There is a nondisplaced and obliquely oriented fracture of the distal fibular metaphysis. Ankle mortise is intact. Medial and posterior malleoli appear intact. Fracture at the base of fifth metatarsal is better seen on dedicated views of the left foot done the same day.   IMPRESSION: 1. Nondisplaced distal fibular fracture. 2. Fracture at the base of the fifth metatarsal is better seen on dedicated views of the left foot done the same day.   Electronically Signed   By: Leanna BattlesMelinda  Blietz M.D.   On: 06/26/2013 11:52   Dg Foot 2 Views Left  06/26/2013   CLINICAL DATA:  Tripping injury with pain, swelling and bruising along the left ankle joint and lateral foot.  EXAM: LEFT FOOT - 2 VIEW  COMPARISON:  Left ankle 06/26/2013.  FINDINGS: A minimally displaced fracture at the base of the fifth metatarsal is seen. Fracture involving the distal fibula is incidentally imaged. No additional evidence of an acute fracture. Mild hallux valgus with minimal first metatarsophalangeal joint degenerative change.  IMPRESSION: 1. Minimally displaced fracture at the base of the fifth metatarsal. 2. Distal fibular fracture is better seen on dedicated views of the ankle done the same day. 3. Hallux valgus and mild first metatarsophalangeal joint osteoarthritis.   Electronically Signed   By: Leanna BattlesMelinda  Blietz M.D.   On: 06/26/2013 11:55     EKG Interpretation None      MDM   Final diagnoses:  Fracture of 5th metatarsal  Fracture of distal fibula    Pt with injury to left foot left week, attempted conservative care at home without improvement.  Xrays show 5th metatarsal fracture and distal fibular fracture.  Vascularly intact.  Some decreased sensation.  Splint, crutches, pain medication, ortho follow up.  Discussed result, findings, treatment, and follow up  with patient.  Pt given return precautions.  Pt verbalizes understanding and agrees with plan.      I personally performed the services described in this documentation, which was scribed in my presence. The recorded information has been reviewed and is accurate.     Trixie Dredgemily West, PA-C 06/26/13 1655

## 2013-06-26 NOTE — Progress Notes (Signed)
Orthopedic Tech Progress Note Patient Details:  Derinda SisBillie Barillas 11/19/1965 [id number] L and U splint applied. Tolerated well. Crutches fit for height and comfort. Ortho Devices Type of Ortho Device: Crutches;Post (short leg) splint;Stirrup splint Ortho Device/Splint Location: Left LE Ortho Device/Splint Interventions: Application   Asia R Thompson 06/26/2013, 12:49 PM

## 2013-06-26 NOTE — Discharge Instructions (Signed)
Read the information below.  Use the prescribed medication as directed.  Please discuss all new medications with your pharmacist.  Do not take additional tylenol while taking the prescribed pain medication to avoid overdose.  You may return to the Emergency Department at any time for worsening condition or any new symptoms that concern you.  If you develop uncontrolled pain, weakness or numbness of the extremity, severe discoloration of the skin, or you are unable to move your toes, return to the ER for a recheck.      Ankle Fracture A fracture is a break in the bone. A cast or splint is used to protect and keep your injured bone from moving.  HOME CARE INSTRUCTIONS   Use your crutches as directed.  To lessen the swelling, keep the injured leg elevated while sitting or lying down.  Apply ice to the injury for 15-20 minutes, 03-04 times per day while awake for 2 days. Put the ice in a plastic bag and place a thin towel between the bag of ice and your cast.  If you have a plaster or fiberglass cast:  Do not try to scratch the skin under the cast using sharp or pointed objects.  Check the skin around the cast every day. You may put lotion on any red or sore areas.  Keep your cast dry and clean.  If you have a plaster splint:  Wear the splint as directed.  You may loosen the elastic around the splint if your toes become numb, tingle, or turn cold or blue.  Do not put pressure on any part of your cast or splint; it may break. Rest your cast only on a pillow the first 24 hours until it is fully hardened.  Your cast or splint can be protected during bathing with a plastic bag. Do not lower the cast or splint into water.  Take medications as directed by your caregiver. Only take over-the-counter or prescription medicines for pain, discomfort, or fever as directed by your caregiver.  Do not drive a vehicle until your caregiver specifically tells you it is safe to do so.  If your caregiver  has given you a follow-up appointment, it is very important to keep that appointment. Not keeping the appointment could result in a chronic or permanent injury, pain, and disability. If there is any problem keeping the appointment, you must call back to this facility for assistance. SEEK IMMEDIATE MEDICAL CARE IF:   Your cast gets damaged or breaks.  You have continued severe pain or more swelling than you did before the cast was put on.  Your skin or toenails below the injury turn blue or gray, or feel cold or numb.  There is a bad smell or new stains and/or purulent (pus like) drainage coming from under the cast. If you do not have a window in your cast for observing the wound, a discharge or minor bleeding may show up as a stain on the outside of your cast. Report these findings to your caregiver. MAKE SURE YOU:   Understand these instructions.  Will watch your condition.  Will get help right away if you are not doing well or get worse. Document Released: 02/07/2000 Document Revised: 05/04/2011 Document Reviewed: 09/08/2012 [facility] Patient Information 2014 [place], [place].  Metatarsal Fracture, Undisplaced A metatarsal fracture is a break in the bone(s) of the foot. These are the bones of the foot that connect your toes to the bones of the ankle. DIAGNOSIS  The diagnoses of these  fractures are usually made with X-rays. If there are problems in the forefoot and x-rays are normal a later bone scan will usually make the diagnosis.  TREATMENT AND HOME CARE INSTRUCTIONS  Treatment may or may not include a cast or walking shoe. When casts are needed the use is usually for short periods of time so as not to slow down healing with muscle wasting (atrophy).  Activities should be stopped until further advised by your caregiver.  Wear shoes with adequate shock absorbing capabilities and stiff soles.  Alternative exercise may be undertaken while waiting for healing. These may include  bicycling and swimming, or as your caregiver suggests.  It is important to keep all follow-up visits or specialty referrals. The failure to keep these appointments could result in improper bone healing and chronic pain or disability.  Warning: Do not drive a car or operate a motor vehicle until your caregiver specifically tells you it is safe to do so. IF YOU DO NOT HAVE A CAST OR SPLINT:  You may walk on your injured foot as tolerated or advised.  Do not put any weight on your injured foot for as long as directed by your caregiver. Slowly increase the amount of time you walk on the foot as the pain allows or as advised.  Use crutches until you can bear weight without pain. A gradual increase in weight bearing may help.  Apply ice to the injury for 15-20 minutes each hour while awake for the first 2 days. Put the ice in a plastic bag and place a towel between the bag of ice and your skin.  Only take over-the-counter or prescription medicines for pain, discomfort, or fever as directed by your caregiver. SEEK IMMEDIATE MEDICAL CARE IF:   Your cast gets damaged or breaks.  You have continued severe pain or more swelling than you did before the cast was put on, or the pain is not controlled with medications.  Your skin or nails below the injury turn blue or grey, or feel cold or numb.  There is a bad smell, or new stains or pus-like (purulent) drainage coming from the cast. MAKE SURE YOU:   Understand these instructions.  Will watch your condition.  Will get help right away if you are not doing well or get worse. Document Released: 11/01/2001 Document Revised: 05/04/2011 Document Reviewed: 09/23/2007 [facility] Patient Information 2014 [place], [place].  Cast or Splint Care Casts and splints support injured limbs and keep bones from moving while they heal. It is important to care for your cast or splint at home.  HOME CARE INSTRUCTIONS  Keep the cast or splint uncovered during the  drying period. It can take 24 to 48 hours to dry if it is made of plaster. A fiberglass cast will dry in less than 1 hour.  Do not rest the cast on anything harder than a pillow for the first 24 hours.  Do not put weight on your injured limb or apply pressure to the cast until your health care provider gives you permission.  Keep the cast or splint dry. Wet casts or splints can lose their shape and may not support the limb as well. A wet cast that has lost its shape can also create harmful pressure on your skin when it dries. Also, wet skin can become infected.  Cover the cast or splint with a plastic bag when bathing or when out in the rain or snow. If the cast is on the trunk of the body,  take sponge baths until the cast is removed.  If your cast does become wet, dry it with a towel or a blow dryer on the cool setting only.  Keep your cast or splint clean. Soiled casts may be wiped with a moistened cloth.  Do not place any hard or soft foreign objects under your cast or splint, such as cotton, toilet paper, lotion, or powder.  Do not try to scratch the skin under the cast with any object. The object could get stuck inside the cast. Also, scratching could lead to an infection. If itching is a problem, use a blow dryer on a cool setting to relieve discomfort.  Do not trim or cut your cast or remove padding from inside of it.  Exercise all joints next to the injury that are not immobilized by the cast or splint. For example, if you have a long leg cast, exercise the hip joint and toes. If you have an arm cast or splint, exercise the shoulder, elbow, thumb, and fingers.  Elevate your injured arm or leg on 1 or 2 pillows for the first 1 to 3 days to decrease swelling and pain.It is best if you can comfortably elevate your cast so it is higher than your heart. SEEK MEDICAL CARE IF:   Your cast or splint cracks.  Your cast or splint is too tight or too loose.  You have unbearable itching  inside the cast.  Your cast becomes wet or develops a soft spot or area.  You have a bad smell coming from inside your cast.  You get an object stuck under your cast.  Your skin around the cast becomes red or raw.  You have new pain or worsening pain after the cast has been applied. SEEK IMMEDIATE MEDICAL CARE IF:   You have fluid leaking through the cast.  You are unable to move your fingers or toes.  You have discolored (blue or white), cool, painful, or very swollen fingers or toes beyond the cast.  You have tingling or numbness around the injured area.  You have severe pain or pressure under the cast.  You have any difficulty with your breathing or have shortness of breath.  You have chest pain. Document Released: 02/07/2000 Document Revised: 11/30/2012 Document Reviewed: 08/18/2012 [facility] Patient Information 2014 [place], [place].   Emergency Department Resource Guide 1) Find a Doctor and Pay Out of Pocket Although you won't have to find out who is covered by your insurance plan, it is a good idea to ask around and get recommendations. You will then need to call the office and see if the doctor you have chosen will accept you as a new patient and what types of options they offer for patients who are self-pay. Some doctors offer discounts or will set up payment plans for their patients who do not have insurance, but you will need to ask so you aren't surprised when you get to your appointment.  2) Contact Your Local Health Department Not all health departments have doctors that can see patients for sick visits, but many do, so it is worth a call to see if yours does. If you don't know where your local health department is, you can check in your phone book. The CDC also has a tool to help you locate your state's health department, and many state websites also have listings of all of their local health departments.  3) Find a Walk-in Clinic If your illness is not likely to be  very severe or complicated, you may want to try a walk in clinic. These are popping up all over the country in pharmacies, drugstores, and shopping centers. They're usually staffed by nurse practitioners or physician assistants that have been trained to treat common illnesses and complaints. They're usually fairly quick and inexpensive. However, if you have serious medical issues or chronic medical problems, these are probably not your best option.  No Primary Care Doctor: - Call Health Connect at  [phone] - they can help you locate a primary care doctor that  accepts your insurance, provides certain services, etc. - Physician Referral Service- [phone]  Chronic Pain Problems: Organization         Address  Phone   Notes  [facility]  [phone]) [phone] Patients need to be referred by their primary care doctor.   Medication Assistance: Organization         Address  Phone   Notes  [facility] Medication [facility] [address] [place], [place] [address] [phone]) [phone] --Must be a resident of [facility] [place] [facility] [place] -- Must have NO insurance coverage whatsoever (no Medicaid/ Medicare, etc.) -- The pt. MUST have a primary care doctor that directs their care regularly and follows them in the community   MedAssist  [phone]   [employer]  [phone]) [phone]    Agencies that provide inexpensive medical care: Organization         Address  Phone   Notes  [facility]  [phone]) [phone]   [facility]    [phone]   [facility] [address] [place], [place] [address] [phone]   [facility] [address], [place] [phone]   Planned Parenthood    [phone]   [facility]    [phone]   [facility]  [address], [place] Phone:  [phone](336) [phone], Fax:  [phone] Hours of Operation:  9 am - 6 pm, M-F.  Also  accepts Medicaid/Medicare and self-pay.  [facility]  [address], [place] Phone: [phone](336) [phone], Fax: [phone]. Hours of Operation:  8:30 am - 5:30 pm, M-F.  Also accepts Medicaid and self-pay.  [facility] [place] [address], [place] Phone: [phone](336) [phone]   [facility] [address], [place], [place] [phone], Ext. [phone] Mondays & Thursdays: 7-9 AM.  First 15 patients are seen on a first come, first serve basis.    Medicaid-accepting [facility] Providers:  Organization         Address  Phone   Notes  [facility] [address], [place] [phone] Also accepts self-pay patients.  [facility] [address], [place]  [phone]) [phone]   [facility] [address], [place] [phone]   [facility] [address] [place] [address], [place] [phone]) [phone]   Renaye RakersVeita Bland [address], [place]   [phone] Only accepts [place] Access [place] patients after they have their name applied to their card.   Self-Pay (no insurance) in [facility] [place]:  Organization         Address  Phone   Notes  Sickle Cell Patients, [facility] [address], [place] [phone]) [phone]   [facility] [phone]  [address], [place] [phone]   [facility] [place]  [address], [place] [employer] [phone]   [facility]/Dr. Osei-Bonsu  [address], [place] or [address], [place] [phone] Phone number for both [place] and [place] locations is the same.  [facility] [address] [place] [phone]   [facility] [address] [place] or [address] [phone]   [facility] [address]  [place] [phone], phone; [phone], fax Sees patients 1st and 3rd Saturday of every month.  Must not qualify for public or private insurance (i.e. Medicaid, Medicare, [place] Health Choice, Veterans' Benefits)  Household income should be no more than 200% of the poverty level The clinic cannot treat you if you are pregnant or think you are pregnant  Sexually transmitted diseases are not treated at the clinic.    Dental Care: Organization         Address  Phone  Notes  [facility] [address] [place], [place] [phone] Accepts children up to age 57 who are enrolled in [place] or [place] Health Choice; pregnant women with a Medicaid card; and children who have applied for Medicaid or [place] Health Choice, but were declined, whose parents can pay a reduced fee at time of service.  [facility] of [facility]  [address], [place] [phone] Accepts children up to age 81 who are enrolled in [place] or [place] Health Choice; pregnant women with a Medicaid card; and children who have applied for Medicaid or [place] Health Choice, but were declined, whose parents can pay a reduced fee at time of service.  [facility]  [address] [place], [place] [phone] Patients are seen by appointment only. Walk-ins are not accepted. [facility] will see patients 24 years of age and older. Monday - Tuesday (8am-5pm) Most Wednesdays (8:30-5pm) $30 per visit, cash only  [facility] Adult Dental Access PROGRAM  [address], [facility] [phone] Patients are seen by appointment only. Walk-ins are not accepted. [facility] will see patients 48 years of age and older. One Wednesday Evening (Monthly: Volunteer Based).  $30 per visit, cash only  [employer]  [phone] for adults; Children under age 37, call Graduate Pediatric Dentistry at [phone].  Children aged 25-14, please call [phone] to request a pediatric application.  Dental services are provided in all areas of dental care including fillings, crowns and bridges, complete and partial dentures, implants, gum treatment, root canals, and extractions. Preventive care is also provided. Treatment is provided to both adults and children. Patients are selected via a lottery and there is often a waiting list.   [facility] [address], [place]  [phone] [email or web address]   [facility] [address] [place], [place] [phone], Ext. [phone] Second and Fourth Thursday of each month, opens at 6:30 AM; Clinic ends at 9 AM.  Patients are seen on a first-come first-served basis, and a limited number are seen during each clinic.   [facility]  [address] [place], [place] [phone]   Eligibility Requirements You must have lived in [place], [place], or [place] counties for at least the last three months.   You  cannot be eligible for state or federal sponsored [place], including [employer], [place], or [employer].   You generally cannot be eligible for healthcare insurance through your employer.    How to apply: Eligibility screenings are held every Tuesday and Wednesday afternoon from 1:00 pm until 4:00 pm. You do not need an appointment for the interview!  [facility] [address], [place], [place] [phone]   [facility] Health Department  [phone]   [facility] Health Department  [phone]   [facility] Health Department  [phone]    Behavioral Health Resources in the Community: Intensive Outpatient Programs Organization         Address  Phone  Notes  [facility] [address], [place], [place] [phone]   [facility]patient [address], [place], [place] [phone]   [facility] [address], [place], [place]  [phone]   [facility] Mental Health [address]  [place], [place] [phone] or [phone]   Substance Abuse Resources Organization         Address  Phone  Notes  Al[facility] Drug Services  [phone]   [facility]  [phone]   [facility] [place]  [phone]   Floydene Flock  [phone]   Residential & Outpatient Substance Abuse Program  [phone]   Psychological Services Organization         Address  Phone  Notes  [facility]  [phone]   [facility] Services  [phone]   [facility] [address] [place] [phone] or [phone]    Mobile Crisis Teams Organization         Address  Phone  Notes  [facility], Mobile Crisis Care Unit  [phone]   [facility]  [address] [place], [place] [phone]   Doristine Locks [address] [place] [phone]    Self-Help/Support Groups Organization         Address  Phone             Notes  [facility]oc[facility] of [place] - variety of support groups  336- I7437963 Call for more information  Narcotics Anonymous (NA), [facility] [address], [employer] [place]  2 meetings at this location   Statistician         Address  Phone  Notes  [facility] [address],    [place]  [phone]   [facility]  [address] [place] [address]24097, [place], [place] [phone]   [facility] [address] [place], [place] [phone] Admissions: 8am-3pm M-F  [facility] [address],    [place], [place] [phone]   [facility] [address] [place], [place], [place] [phone]   [facility] [address],  [place], [place] [phone]   [facility] - Intensive Outpatient [address], [place], [place] [phone]     [facility] ([facility] [address]  [place], [place] [phone] or [phone]   [facility] ([facility]S) [address], [place], [place] [phone] Accepts Medicaid  [facility] [place] [address]  [place] [phone] Substance Abuse/Addiction Treatment   [facility] Organization         Address  Phone  Notes  [facility]  [phone]   Angie Fava, PhD [phone] [address], [place]   [phone] or ([place]) [place], [place] [phone]   [facility] [address], [place], [place] [phone] Insurance/Medicaid/sponsorship through [facility] and Families [address] [place], [place] [phone] [place], [place] [phone]    Dr. Adele Schilder  [phone]   [facility] [facility] Dept. 1) [address], [place] 2) Branch 3)  [address], [place] [phone]  [phone]   [place] [phone] or [phone] (After Hours)

## 2013-06-26 NOTE — ED Notes (Signed)
Missed a step, twisting left foot/ankle. Swelling and yellow colored bruising noted to left foot. States has been keeping it elevated and iced at first.

## 2013-06-27 NOTE — ED Provider Notes (Signed)
Medical screening examination/treatment/procedure(s) were performed by non-physician practitioner and as supervising physician I was immediately available for consultation/collaboration.   EKG Interpretation None       Derwood KaplanAnkit Nanavati, MD 06/27/13 1901

## 2013-10-21 ENCOUNTER — Encounter (HOSPITAL_COMMUNITY): Payer: Self-pay | Admitting: Emergency Medicine

## 2013-10-21 ENCOUNTER — Emergency Department (HOSPITAL_COMMUNITY)
Admission: EM | Admit: 2013-10-21 | Discharge: 2013-10-21 | Disposition: A | Discharge status: Home / Self Care | Payer: Self-pay | Attending: Emergency Medicine | Admitting: Emergency Medicine

## 2013-10-21 DIAGNOSIS — K029 Dental caries, unspecified: Secondary | ICD-10-CM | POA: Insufficient documentation

## 2013-10-21 DIAGNOSIS — K089 Disorder of teeth and supporting structures, unspecified: Secondary | ICD-10-CM | POA: Insufficient documentation

## 2013-10-21 DIAGNOSIS — F17200 Nicotine dependence, unspecified, uncomplicated: Secondary | ICD-10-CM | POA: Insufficient documentation

## 2013-10-21 DIAGNOSIS — K0889 Other specified disorders of teeth and supporting structures: Secondary | ICD-10-CM

## 2013-10-21 DIAGNOSIS — I10 Essential (primary) hypertension: Secondary | ICD-10-CM | POA: Insufficient documentation

## 2013-10-21 MED ORDER — OXYCODONE-ACETAMINOPHEN 5-325 MG PO TABS: 1.0000 | ORAL_TABLET | ORAL | Status: DC | PRN

## 2013-10-21 MED ORDER — OXYCODONE-ACETAMINOPHEN 5-325 MG PO TABS
1.0000 | ORAL_TABLET | Freq: Once | ORAL | Status: AC
  Administered 2013-10-21: 1 via ORAL
  Filled 2013-10-21: qty 1

## 2013-10-21 MED ORDER — PENICILLIN V POTASSIUM 500 MG PO TABS
500.0000 mg | ORAL_TABLET | Freq: Once | ORAL | Status: AC
  Administered 2013-10-21: 500 mg via ORAL
  Filled 2013-10-21: qty 1

## 2013-10-21 MED ORDER — PENICILLIN V POTASSIUM 500 MG PO TABS: 500.0000 mg | ORAL_TABLET | Freq: Four times a day (QID) | ORAL | Status: DC

## 2013-10-21 NOTE — ED Provider Notes (Signed)
CSN: [id number]     Arrival date & time 10/21/13  1218 History  This chart was scribed for a non-physician practitioner, Melvenia Beam A. Junius Finner, working with Doug Sou, MD by [place] Peace, ED Scribe. The patient was seen in [facility]. The patient's care was started at 12:49 PM.    Chief Complaint  Patient presents with  . Dental Pain      Patient is a 48 y.o. female presenting with tooth pain. The history is provided by the patient. No language interpreter was used.  Dental Pain Location:  Upper Severity:  Severe Onset quality:  Gradual Timing:  Constant Progression:  Worsening Context: abscess, dental caries and dental fracture   Relieved by:  None tried Worsened by:  Cold food/drink and touching Ineffective treatments:  None tried Associated symptoms: facial pain and gum swelling   Associated symptoms: no fever and no neck pain   Risk factors: lack of dental care and smoking   Risk factors: no cancer and no diabetes    HPI Comments: Claire [name] is a 48 y.o. female who presents to the Emergency Department complaining of abscess to her upper right gum with associated facial pain and gum swelling. She reports that she was eating something this morning and also experienced her upper left canine tooth crack. She further reports that she is currently holding this in place with partial denture plate. Pt states that she does not have access to dental care currently but is seeking get some. Pt is a current everyday smoker but reports she is in the process of trying to quit.    Past Medical History  Diagnosis Date  . Hypertension    Past Surgical History  Procedure Laterality Date  . Cesarean section    . Cholecystectomy    . Heel spur surgery    . Ankle fracture surgery    . Abdominal hysterectomy    . Arm surgery Right    No family history on file. History  Substance Use Topics  . Smoking status: Current Every Day Smoker    Types: Cigarettes  . Smokeless tobacco: Not  on file  . Alcohol Use: No   OB History   Grav Para Term Preterm Abortions TAB SAB Ect Mult Living                 Review of Systems  Constitutional: Negative for fever.  HENT: Positive for dental problem.   Gastrointestinal: Negative for nausea, vomiting and diarrhea.  Musculoskeletal: Negative for neck pain.  All other systems reviewed and are negative.     Allergies  Toradol  Home Medications   Prior to Admission medications   Medication Sig Start Date End Date Taking? Authorizing Provider  ibuprofen (ADVIL,MOTRIN) 200 MG tablet Take 600-800 mg by mouth every 6 (six) hours as needed for moderate pain.    Historical Provider, MD  oxyCODONE-acetaminophen (PERCOCET/ROXICET) 5-325 MG per tablet Take 1-2 tablets by mouth every 4 (four) hours as needed for moderate pain or severe pain. 06/26/13   Trixie Dredge, PA-C   BP 151/99  Pulse 104  Temp(Src) 97.7 F (36.5 C) (Oral)  Resp 18  SpO2 99% Physical Exam  Nursing note and vitals reviewed. Constitutional: She is oriented to person, place, and time. She appears well-developed and well-nourished. No distress.  HENT:  Head: Normocephalic and atraumatic.  Widespread dental decay. 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 There is marked decay to the upper left canine with significant tenderness and sensitivity to cold  air.   Eyes: Conjunctivae and EOM are normal.  Neck: Neck supple. No tracheal deviation present.  Cardiovascular: Normal rate.   Pulmonary/Chest: Effort normal. No respiratory distress.  Musculoskeletal: Normal range of motion.  Neurological: She is alert and oriented to person, place, and time.  Skin: Skin is warm and dry.  Psychiatric: She has a normal mood and affect. Her behavior is normal.    ED Course  Procedures (including critical care time) Labs Review Labs Reviewed - No data to display No results found.  Imaging Review No results found.   EKG Interpretation None     Medications - No  data to display  12:54PM- Treatment plan was discussed with patient who verbalizes understanding and agrees.   MDM   Final diagnoses:  None    1. Dental pain 2. Dental caries  Uncomplicated dental pain. REfer to dentistry, provide pain medication/antibiotics.   I personally performed the services described in this documentation, which was scribed in my presence. The recorded information has been reviewed and is accurate.     Arnoldo Hooker, PA-C 10/21/13 1318

## 2013-10-21 NOTE — Discharge Instructions (Signed)
Dental Caries °Dental caries (also called tooth decay) is the most common oral disease. It can occur at any age but is more common in children and young adults.  °HOW DENTAL CARIES DEVELOPS  °The process of decay begins when bacteria and foods (particularly sugars and starches) combine in your mouth to produce plaque. Plaque is a substance that sticks to the hard, outer surface of a tooth (enamel). The bacteria in plaque produce acids that attack enamel. These acids may also attack the root surface of a tooth (cementum) if it is exposed. Repeated attacks dissolve these surfaces and create holes in the tooth (cavities). If left untreated, the acids destroy the other layers of the tooth.  °RISK FACTORS °· Frequent sipping of sugary beverages.   °· Frequent snacking on sugary and starchy foods, especially those that easily get stuck in the teeth.   °· Poor oral hygiene.   °· Dry mouth.   °· Substance abuse such as methamphetamine abuse.   °· Broken or poor-fitting dental restorations.   °· Eating disorders.   °· Gastroesophageal reflux disease (GERD).   °· Certain radiation treatments to the head and neck. °SYMPTOMS °In the early stages of dental caries, symptoms are seldom present. Sometimes white, chalky areas may be seen on the enamel or other tooth layers. In later stages, symptoms may include: °· Pits and holes on the enamel. °· Toothache after sweet, hot, or cold foods or drinks are consumed. °· Pain around the tooth. °· Swelling around the tooth. °DIAGNOSIS  °Most of the time, dental caries is detected during a regular dental checkup. A diagnosis is made after a thorough medical and dental history is taken and the surfaces of your teeth are checked for signs of dental caries. Sometimes special instruments, such as lasers, are used to check for dental caries. Dental X-ray exams may be taken so that areas not visible to the eye (such as between the contact areas of the teeth) can be checked for cavities.    °TREATMENT  °If dental caries is in its early stages, it may be reversed with a fluoride treatment or an application of a remineralizing agent at the dental office. Thorough brushing and flossing at home is needed to aid these treatments. If it is in its later stages, treatment depends on the location and extent of tooth destruction:  °· If a small area of the tooth has been destroyed, the destroyed area will be removed and cavities will be filled with a material such as gold, silver amalgam, or composite resin.   °· If a large area of the tooth has been destroyed, the destroyed area will be removed and a cap (crown) will be fitted over the remaining tooth structure.   °· If the center part of the tooth (pulp) is affected, a procedure called a root canal will be needed before a filling or crown can be placed.   °· If most of the tooth has been destroyed, the tooth may need to be pulled (extracted). °HOME CARE INSTRUCTIONS °You can prevent, stop, or reverse dental caries at home by practicing good oral hygiene. Good oral hygiene includes: °· Thoroughly cleaning your teeth at least twice a day with a toothbrush and dental floss.   °· Using a fluoride toothpaste. A fluoride mouth rinse may also be used if recommended by your dentist or health care provider.   °· Restricting the amount of sugary and starchy foods and sugary liquids you consume.   °· Avoiding frequent snacking on these foods and sipping of these liquids.   °· Keeping regular visits with   a dentist for checkups and cleanings. PREVENTION   Practice good oral hygiene.  Consider a dental sealant. A dental sealant is a coating material that is applied by your dentist to the pits and grooves of teeth. The sealant prevents food from being trapped in them. It may protect the teeth for several years.  Ask about fluoride supplements if you live in a community without fluorinated water or with water that has a low fluoride content. Use fluoride supplements  as directed by your dentist or health care provider.  Allow fluoride varnish applications to teeth if directed by your dentist or health care provider. Document Released: 11/01/2001 Document Revised: 06/26/2013 Document Reviewed: 02/12/2012 [facility] Patient Information 2015 [place], [place]. This information is not intended to replace advice given to you by your health care provider. Make sure you discuss any questions you have with your health care provider.  Dental Care and Dentist Visits Dental care supports good overall health. Regular dental visits can also help you avoid dental pain, bleeding, infection, and other more serious health problems in the future. It is important to keep the mouth healthy because diseases in the teeth, gums, and other oral tissues can spread to other areas of the body. Some problems, such as diabetes, heart disease, and pre-term labor have been associated with poor oral health.  See your dentist every 6 months. If you experience emergency problems such as a toothache or broken tooth, go to the dentist right away. If you see your dentist regularly, you may catch problems early. It is easier to be treated for problems in the early stages.  WHAT TO EXPECT AT A DENTIST VISIT  Your dentist will look for many common oral health problems and recommend proper treatment. At your regular dental visit, you can expect:  Gentle cleaning of the teeth and gums. This includes scraping and polishing. This helps to remove the sticky substance around the teeth and gums (plaque). Plaque forms in the mouth shortly after eating. Over time, plaque hardens on the teeth as tartar. If tartar is not removed regularly, it can cause problems. Cleaning also helps remove stains.  Periodic X-rays. These pictures of the teeth and supporting bone will help your dentist assess the health of your teeth.  Periodic fluoride treatments. Fluoride is a natural mineral shown to help strengthen teeth. Fluoride  treatmentinvolves applying a fluoride gel or varnish to the teeth. It is most commonly done in children.  Examination of the mouth, tongue, jaws, teeth, and gums to look for any oral health problems, such as:  Cavities (dental caries). This is decay on the tooth caused by plaque, sugar, and acid in the mouth. It is best to catch a cavity when it is small.  Inflammation of the gums caused by plaque buildup (gingivitis).  Problems with the mouth or malformed or misaligned teeth.  Oral cancer or other diseases of the soft tissues or jaws. KEEP YOUR TEETH AND GUMS HEALTHY For healthy teeth and gums, follow these general guidelines as well as your dentist's specific advice:  Have your teeth professionally cleaned at the dentist every 6 months.  Brush twice daily with a fluoride toothpaste.  Floss your teeth daily.  Ask your dentist if you need fluoride supplements, treatments, or fluoride toothpaste.  Eat a healthy diet. Reduce foods and drinks with added sugar.  Avoid smoking. TREATMENT FOR ORAL HEALTH PROBLEMS If you have oral health problems, treatment varies depending on the conditions present in your teeth and gums.  Your caregiver  will most likely recommend good oral hygiene at each visit.  For cavities, gingivitis, or other oral health disease, your caregiver will perform a procedure to treat the problem. This is typically done at a separate appointment. Sometimes your caregiver will refer you to another dental specialist for specific tooth problems or for surgery. SEEK IMMEDIATE DENTAL CARE IF:  You have pain, bleeding, or soreness in the gum, tooth, jaw, or mouth area.  A permanent tooth becomes loose or separated from the gum socket.  You experience a blow or injury to the mouth or jaw area. Document Released: 10/22/2010 Document Revised: 05/04/2011 Document Reviewed: 10/22/2010 [facility] Patient Information 2015 [place], [place]. This information is not intended to  replace advice given to you by your health care provider. Make sure you discuss any questions you have with your health care provider.   Emergency Department Resource Guide 1) Find a Doctor and Pay Out of Pocket Although you won't have to find out who is covered by your insurance plan, it is a good idea to ask around and get recommendations. You will then need to call the office and see if the doctor you have chosen will accept you as a new patient and what types of options they offer for patients who are self-pay. Some doctors offer discounts or will set up payment plans for their patients who do not have insurance, but you will need to ask so you aren't surprised when you get to your appointment.  2) Contact Your Local Health Department Not all health departments have doctors that can see patients for sick visits, but many do, so it is worth a call to see if yours does. If you don't know where your local health department is, you can check in your phone book. The CDC also has a tool to help you locate your state's health department, and many state websites also have listings of all of their local health departments.  3) Find a Walk-in Clinic If your illness is not likely to be very severe or complicated, you may want to try a walk in clinic. These are popping up all over the country in pharmacies, drugstores, and shopping centers. They're usually staffed by nurse practitioners or physician assistants that have been trained to treat common illnesses and complaints. They're usually fairly quick and inexpensive. However, if you have serious medical issues or chronic medical problems, these are probably not your best option.  No Primary Care Doctor: - Call Health Connect at  [phone] - they can help you locate a primary care doctor that  accepts your insurance, provides certain services, etc. - Physician Referral [phone]  Dental Care: Organization          Address  Phone  Notes  [facility] Department of [facility] [address] [place], [place] [phone] Accepts children up to age 8 who are enrolled in [place] or [place] Health Choice; pregnant women with a Medicaid card; and children who have applied for Medicaid or [place] Health Choice, but were declined, whose parents can pay a reduced fee at time of service.  [facility] Department of [facility]  [address], [place] [phone] Accepts children up to age 33 who are enrolled in [place] or [place] Health Choice; pregnant women with a Medicaid card; and children who have applied for Medicaid or [place] Health Choice, but were declined, whose parents can pay a reduced fee at time of service.  [facility]  [address] [place], [place] [phone] Patients are seen by appointment only. Walk-ins are not accepted. [facility] will see patients 14 years of age and older. Monday - Tuesday (8am-5pm) Most Wednesdays (8:30-5pm) $30 per visit, cash only  [facility] Adult Dental [facility]  [address], [facility] [phone] Patients are seen by appointment only. Walk-ins are not accepted. [facility] will see patients 37 years of age and older. One Wednesday Evening (Monthly: Volunteer Based).  $30 per visit, cash only  [employer]  [phone] for adults; Children under age 30, call Graduate Pediatric Dentistry at [phone]. Children aged 40-14, please call [phone] to request a pediatric application.  Dental services are provided in all areas of dental care including fillings, crowns and bridges, complete and partial dentures, implants, gum treatment, root canals, and extractions. Preventive care is also provided. Treatment is provided to both adults and children. Patients are selected via a lottery and there is often a waiting list.   [facility] [address], [place]  [phone] [email or web address]   [facility] [address] [place], [place] [phone], Ext. [phone] Second and Fourth Thursday of each month, opens at 6:30 AM; Clinic ends at 9 AM.  Patients are seen on a first-come first-served basis, and a limited number are seen during each clinic.   [facility]  [address] [place], [place] [phone]   Eligibility Requirements You must have lived in [place], [place], or [place] counties for at least the last three months.   You cannot be eligible for state or federal sponsored [place], including [employer], [place], or [employer].   You generally cannot be eligible for healthcare insurance through your employer.    How to apply: Eligibility screenings are held every Tuesday and Wednesday afternoon from 1:00 pm until 4:00 pm. You do not need an appointment for the interview!  [facility] [address], [place], [place] [phone]   [facility] Health Department  [phone]   [facility] Health Department  [phone]   [facility] Health Department  [phone]

## 2013-10-21 NOTE — ED Provider Notes (Signed)
Medical screening examination/treatment/procedure(s) were performed by non-physician practitioner and as supervising physician I was immediately available for consultation/collaboration.   EKG Interpretation None       Sam Jacubowitz, MD 10/21/13 1623 

## 2013-10-21 NOTE — ED Notes (Signed)
Pt reports dental/gum abcess upper right. Also reports she was eating this am and upper left canine tooth cracked, she is holding this in place currently with a partial denture plate. Extensive dental caries noted.

## 2013-10-28 ENCOUNTER — Encounter (HOSPITAL_COMMUNITY): Payer: Self-pay | Admitting: Emergency Medicine

## 2013-10-28 ENCOUNTER — Emergency Department (HOSPITAL_COMMUNITY)
Admission: EM | Admit: 2013-10-28 | Discharge: 2013-10-28 | Disposition: A | Discharge status: Home / Self Care | Payer: Self-pay | Attending: Emergency Medicine | Admitting: Emergency Medicine

## 2013-10-28 DIAGNOSIS — F17200 Nicotine dependence, unspecified, uncomplicated: Secondary | ICD-10-CM | POA: Insufficient documentation

## 2013-10-28 DIAGNOSIS — Z792 Long term (current) use of antibiotics: Secondary | ICD-10-CM | POA: Insufficient documentation

## 2013-10-28 DIAGNOSIS — I10 Essential (primary) hypertension: Secondary | ICD-10-CM | POA: Insufficient documentation

## 2013-10-28 DIAGNOSIS — K089 Disorder of teeth and supporting structures, unspecified: Secondary | ICD-10-CM | POA: Insufficient documentation

## 2013-10-28 DIAGNOSIS — K0889 Other specified disorders of teeth and supporting structures: Secondary | ICD-10-CM

## 2013-10-28 MED ORDER — TRAMADOL HCL 50 MG PO TABS: 50.0000 mg | ORAL_TABLET | Freq: Four times a day (QID) | ORAL | Status: DC | PRN

## 2013-10-28 MED ORDER — IBUPROFEN 800 MG PO TABS: 800.0000 mg | ORAL_TABLET | Freq: Three times a day (TID) | ORAL | Status: DC

## 2013-10-28 NOTE — ED Provider Notes (Signed)
Medical screening examination/treatment/procedure(s) were performed by non-physician practitioner and as supervising physician I was immediately available for consultation/collaboration.   EKG Interpretation None       Ethelda Chick, MD 10/28/13 1850

## 2013-10-28 NOTE — ED Notes (Signed)
Pt reports that she was seen on 10/21/2013 for dental pain. Pt presents again for the same complaint due to pain, pt states that she can not see a dentist until this coming Friday. Pt states that there is also a "knot" above her tooth causing increased pain. Pt is A/O x4, in NAD, and vitals are WDL.

## 2013-10-28 NOTE — Discharge Instructions (Signed)
Continue penicillin. Tramadol and ibuprofen for pain. Follow up with a dentist on Friday.    Dental Pain A tooth ache may be caused by cavities (tooth decay). Cavities expose the nerve of the tooth to air and hot or cold temperatures. It may come from an infection or abscess (also called a boil or furuncle) around your tooth. It is also often caused by dental caries (tooth decay). This causes the pain you are having. DIAGNOSIS  Your caregiver can diagnose this problem by exam. TREATMENT   If caused by an infection, it may be treated with medications which kill germs (antibiotics) and pain medications as prescribed by your caregiver. Take medications as directed.  Only take over-the-counter or prescription medicines for pain, discomfort, or fever as directed by your caregiver.  Whether the tooth ache today is caused by infection or dental disease, you should see your dentist as soon as possible for further care. SEEK MEDICAL CARE IF: The exam and treatment you received today has been provided on an emergency basis only. This is not a substitute for complete medical or dental care. If your problem worsens or new problems (symptoms) appear, and you are unable to meet with your dentist, call or return to this location. SEEK IMMEDIATE MEDICAL CARE IF:   You have a fever.  You develop redness and swelling of your face, jaw, or neck.  You are unable to open your mouth.  You have severe pain uncontrolled by pain medicine. MAKE SURE YOU:   Understand these instructions.  Will watch your condition.  Will get help right away if you are not doing well or get worse. Document Released: 02/09/2005 Document Revised: 05/04/2011 Document Reviewed: 09/28/2007 [facility] Patient Information 2015 [place], [place]. This information is not intended to replace advice given to you by your health care provider. Make sure you discuss any questions you have with your health care provider.

## 2013-10-28 NOTE — ED Provider Notes (Signed)
CSN: [id number]     Arrival date & time 10/28/13  1613 History   First MD Initiated Contact with Patient 10/28/13 1618    This chart was scribed for Ethelda Chick, MD by Marica Otter, ED Scribe. This patient was seen in room WTR9/WTR9 and the patient's care was started at 4:27 PM.  Chief Complaint  Patient presents with  . Dental Pain   The history is provided by the patient. No language interpreter was used.   PCP: No PCP Per Patient HPI Comments: Claire [name] is a 48 y.o. female, with a Hx of HTN, tobacco use, cholecystectomy, abd hysterectomy, who presents to the Emergency Department complaining of dental pain. Pt reports she presented to, and was treated at, the ED for an abscess to her right upper gum with associated facial pain and gum swelling one week ago. Pt states that she was prescribed percocet and penicillin during said visit. Pt states that she has been compliant with the penicillin and took her last percocet this past Wednesday. Pt complains of the same pain today and states that her pain is made worse from her left tooth breaking off.    Past Medical History  Diagnosis Date  . Hypertension    Past Surgical History  Procedure Laterality Date  . Cesarean section    . Cholecystectomy    . Heel spur surgery    . Ankle fracture surgery    . Abdominal hysterectomy    . Arm surgery Right    No family history on file. History  Substance Use Topics  . Smoking status: Current Every Day Smoker    Types: Cigarettes  . Smokeless tobacco: Not on file  . Alcohol Use: No   OB History   Grav Para Term Preterm Abortions TAB SAB Ect Mult Living                 Review of Systems  Constitutional: Negative for fever and chills.  HENT: Positive for dental problem.   Psychiatric/Behavioral: Negative for confusion.      Allergies  Toradol  Home Medications   Prior to Admission medications   Medication Sig Start Date End Date Taking? Authorizing Provider  ibuprofen  (ADVIL,MOTRIN) 200 MG tablet Take 600-800 mg by mouth every 6 (six) hours as needed for moderate pain.    Historical Provider, MD  oxyCODONE-acetaminophen (PERCOCET/ROXICET) 5-325 MG per tablet Take 1-2 tablets by mouth every 4 (four) hours as needed for moderate pain or severe pain. 06/26/13   Trixie Dredge, PA-C  oxyCODONE-acetaminophen (PERCOCET/ROXICET) 5-325 MG per tablet Take 1-2 tablets by mouth every 4 (four) hours as needed for severe pain. 10/21/13   Shari A Upstill, PA-C  penicillin v potassium (VEETID) 500 MG tablet Take 1 tablet (500 mg total) by mouth 4 (four) times daily. 10/21/13   Shari A Upstill, PA-C   BP 154/99  Pulse 100  Temp(Src) 98.2 F (36.8 C) (Oral)  Resp 18  SpO2 99% Physical Exam  Nursing note and vitals reviewed. Constitutional: She is oriented to person, place, and time. She appears well-developed and well-nourished. No distress.  HENT:  Head: Normocephalic and atraumatic.  Left upper first bicuspid chipped to the gumline. Mild surrounding gum swelling. No fluctuance. Tender to palpation. No trismus, no swelling under the tongue.  Eyes: Conjunctivae and EOM are normal.  Neck: Neck supple. No tracheal deviation present.  Cardiovascular: Normal rate.   Pulmonary/Chest: Effort normal. No respiratory distress.  Musculoskeletal: Normal range of motion.  Neurological:  She is alert and oriented to person, place, and time.  Skin: Skin is warm and dry.  Psychiatric: She has a normal mood and affect. Her behavior is normal.    ED Course  Procedures (including critical care time) DIAGNOSTIC STUDIES:   COORDINATION OF CARE: 4:30 PM-Discussed treatment plan which includes tramadol, ibuprofen and advising pt to f/u with dentist with pt at bedside and pt agreed to plan.   Labs Review Labs Reviewed - No data to display  Imaging Review No results found.   EKG Interpretation None      MDM   Final diagnoses:  Pain, dental    Patient with left upper first  bicuspid toothache. Mild surrounding gum swelling. Suspect possibly infection. She's already taking penicillin. No obvious abscess. She is requesting pain medications, states Percocet works for her. She has been here multiple times for pain medications, explained to her that we do not treat chronic condition, will give tramadol at this time and ibuprofen. She states available to see her dentist on Friday. Patient is agreeable to the plan, will discharge home with a followup.  Filed Vitals:   10/28/13 1616  BP: 154/99  Pulse: 100  Temp: 98.2 F (36.8 C)  TempSrc: Oral  Resp: 18  SpO2: 99%     I personally performed the services described in this documentation, which was scribed in my presence. The recorded information has been reviewed and is accurate.    Lottie Mussel, PA-C 10/28/13 1805

## 2013-11-13 ENCOUNTER — Emergency Department (HOSPITAL_COMMUNITY): Payer: Self-pay

## 2013-11-13 ENCOUNTER — Emergency Department (HOSPITAL_COMMUNITY)
Admission: EM | Admit: 2013-11-13 | Discharge: 2013-11-13 | Disposition: A | Discharge status: Home / Self Care | Payer: Self-pay | Attending: Emergency Medicine | Admitting: Emergency Medicine

## 2013-11-13 ENCOUNTER — Encounter (HOSPITAL_COMMUNITY): Payer: Self-pay | Admitting: Emergency Medicine

## 2013-11-13 DIAGNOSIS — I10 Essential (primary) hypertension: Secondary | ICD-10-CM | POA: Insufficient documentation

## 2013-11-13 DIAGNOSIS — S92309A Fracture of unspecified metatarsal bone(s), unspecified foot, initial encounter for closed fracture: Secondary | ICD-10-CM | POA: Insufficient documentation

## 2013-11-13 DIAGNOSIS — S99929A Unspecified injury of unspecified foot, initial encounter: Secondary | ICD-10-CM

## 2013-11-13 DIAGNOSIS — S92302G Fracture of unspecified metatarsal bone(s), left foot, subsequent encounter for fracture with delayed healing: Secondary | ICD-10-CM

## 2013-11-13 DIAGNOSIS — Y939 Activity, unspecified: Secondary | ICD-10-CM | POA: Insufficient documentation

## 2013-11-13 DIAGNOSIS — S8990XA Unspecified injury of unspecified lower leg, initial encounter: Secondary | ICD-10-CM | POA: Insufficient documentation

## 2013-11-13 DIAGNOSIS — F17200 Nicotine dependence, unspecified, uncomplicated: Secondary | ICD-10-CM | POA: Insufficient documentation

## 2013-11-13 DIAGNOSIS — Z79899 Other long term (current) drug therapy: Secondary | ICD-10-CM | POA: Insufficient documentation

## 2013-11-13 DIAGNOSIS — X58XXXA Exposure to other specified factors, initial encounter: Secondary | ICD-10-CM | POA: Insufficient documentation

## 2013-11-13 DIAGNOSIS — Y929 Unspecified place or not applicable: Secondary | ICD-10-CM | POA: Insufficient documentation

## 2013-11-13 DIAGNOSIS — S99919A Unspecified injury of unspecified ankle, initial encounter: Secondary | ICD-10-CM

## 2013-11-13 MED ORDER — HYDROCODONE-ACETAMINOPHEN 5-325 MG PO TABS: 1.0000 | ORAL_TABLET | Freq: Four times a day (QID) | ORAL | Status: DC | PRN

## 2013-11-13 MED ORDER — OXYCODONE-ACETAMINOPHEN 5-325 MG PO TABS
1.0000 | ORAL_TABLET | Freq: Once | ORAL | Status: AC
  Administered 2013-11-13: 1 via ORAL
  Filled 2013-11-13: qty 1

## 2013-11-13 MED ORDER — IBUPROFEN 600 MG PO TABS: 600.0000 mg | ORAL_TABLET | Freq: Four times a day (QID) | ORAL | Status: DC | PRN

## 2013-11-13 NOTE — ED Provider Notes (Signed)
Medical screening examination/treatment/procedure(s) were performed by non-physician practitioner and as supervising physician I was immediately available for consultation/collaboration.  Scott T Goldston, MD 11/13/13 1646 

## 2013-11-13 NOTE — ED Notes (Signed)
States broke her left  foot and was not able to go to dr for f/u and now she she thinks she has hurt her foot again yesterday  Elmo Putt how

## 2013-11-13 NOTE — ED Provider Notes (Signed)
CSN: [id number]     Arrival date & time 11/13/13  0821 History  This chart was scribed for non-physician practitioner, Jaynie Crumble, PA-C working with Audree Camel, MD by Greggory Stallion, ED scribe. This patient was seen in room TR11C/TR11C and the patient's care was started at 9:44 AM.   Chief Complaint  Patient presents with  . Foot Pain   The history is provided by the patient. No language interpreter was used.   HPI Comments: Claire [name] is a 48 y.o. female who presents to the Emergency Department complaining of possible left foot injury that occurred yesterday. Pt is unsure how she might have injured it but reports worsening pain with associated swelling. Movement worsens the pain. She broke her left foot and ankle 4 months ago and was told to follow up with orthopedics but states she wasn't able to afford it. Foot is tender to palpation and painful with walking. No tx prior to coming in.   Past Medical History  Diagnosis Date  . Hypertension    Past Surgical History  Procedure Laterality Date  . Cesarean section    . Cholecystectomy    . Heel spur surgery    . Ankle fracture surgery    . Abdominal hysterectomy    . Arm surgery Right    No family history on file. History  Substance Use Topics  . Smoking status: Current Every Day Smoker    Types: Cigarettes  . Smokeless tobacco: Not on file  . Alcohol Use: No   OB History   Grav Para Term Preterm Abortions TAB SAB Ect Mult Living                 Review of Systems  Musculoskeletal: Positive for arthralgias and joint swelling.  All other systems reviewed and are negative.  Allergies  Toradol  Home Medications   Prior to Admission medications   Medication Sig Start Date End Date Taking? Authorizing Provider  ibuprofen (ADVIL,MOTRIN) 200 MG tablet Take 600-800 mg by mouth every 6 (six) hours as needed for moderate pain.    Historical Provider, MD  ibuprofen (ADVIL,MOTRIN) 800 MG tablet Take 1 tablet (800  mg total) by mouth 3 (three) times daily. 10/28/13   Tatyana A Kirichenko, PA-C  oxyCODONE-acetaminophen (PERCOCET/ROXICET) 5-325 MG per tablet Take 1-2 tablets by mouth every 4 (four) hours as needed for moderate pain or severe pain. 06/26/13   Trixie Dredge, PA-C  oxyCODONE-acetaminophen (PERCOCET/ROXICET) 5-325 MG per tablet Take 1-2 tablets by mouth every 4 (four) hours as needed for severe pain. 10/21/13   Shari A Upstill, PA-C  penicillin v potassium (VEETID) 500 MG tablet Take 1 tablet (500 mg total) by mouth 4 (four) times daily. 10/21/13   Shari A Upstill, PA-C  traMADol (ULTRAM) 50 MG tablet Take 1 tablet (50 mg total) by mouth every 6 (six) hours as needed. 10/28/13   Tatyana A Kirichenko, PA-C   BP 157/96  Pulse 102  Temp(Src) 97.8 F (36.6 C) (Oral)  Resp 22  Ht  (1.676 m)  Wt 175 lb (79.379 kg)  BMI 28.26 kg/m2  SpO2 95% Physical Exam  Nursing note and vitals reviewed. Constitutional: She is oriented to person, place, and time. She appears well-developed and well-nourished. No distress.  HENT:  Head: Normocephalic and atraumatic.  Eyes: Conjunctivae and EOM are normal.  Neck: Neck supple. No tracheal deviation present.  Cardiovascular: Normal [id number] refill less than 2 seconds distally.  Pulmonary/Chest: Effort normal. No  respiratory distress.  Musculoskeletal: Normal range of motion.  Tenderness over fifth metatarsal. Mild swelling over the area. No tenderness of medial or lateral malleoli of ankle. Pain with any ROM of ankle.   Neurological: She is alert and oriented to person, place, and time.  Skin: Skin is warm and dry.  Psychiatric: She has a normal mood and affect. Her behavior is normal.    ED Course  Procedures (including critical care time)  DIAGNOSTIC STUDIES: Oxygen Saturation is 95% on RA, adequate by my interpretation.    COORDINATION OF CARE: 9:45 AM-Discussed treatment plan which includes xray and pain medication with pt at bedside and pt  agreed to plan.   Labs Review Labs Reviewed - No data to display  Imaging Review Dg Foot Complete Left  11/13/2013   CLINICAL DATA:  Plantar left foot pain.  No reported injury.  EXAM: LEFT FOOT - COMPLETE 3+ VIEW  COMPARISON:  06/26/2013.  FINDINGS: The previously demonstrated fractures of the base of the fifth metatarsal and distal fibula are again demonstrated. These appear partially healed. No new fractures are seen. Mild posterior and inferior calcaneal spur formation is noted.  IMPRESSION: Incompletely healed fractures of the base of the fifth metatarsal and distal fibula.   Electronically Signed   By: Gordan Payment M.D.   On: 11/13/2013 10:08     EKG Interpretation None      MDM   Final diagnoses:  Fracture of 5th metatarsal, left, with delayed healing, subsequent encounter    Pt with incompletely healed left 5th metatarsal fracture. Fracture originally 4 months ago. Will place in CAM walker, follow up with orthopedics. Pt never followed up before.   Filed Vitals:   11/13/13 0827 11/13/13 1009  BP: 157/96 133/93  Pulse: 102 85  Temp: 97.8 F (36.6 C) 97.4 F (36.3 C)  TempSrc: Oral Oral  Resp: 22 17  Height:  (1.676 m)   Weight: 175 lb (79.379 kg)   SpO2: 95% 100%     I personally performed the services described in this documentation, which was scribed in my presence. The recorded information has been reviewed and is accurate.  Lottie Mussel, PA-C 11/13/13 1201

## 2013-11-13 NOTE — Discharge Instructions (Signed)
Keep foot elevated. Ice. Stay off of it as much as able. Ibuprofen for pain. norco for severe pain. Follow up with orthopedics doctor.  Your foot did not heal as it should have been by now, and it is imperative that you see an orthopedics specialist.

## 2013-11-14 NOTE — Discharge Planning (Signed)
[facility] [employer] guide and my contact information given for any future questions or concerns.

## 2014-07-25 ENCOUNTER — Telehealth: Payer: Self-pay | Admitting: *Deleted

## 2014-07-25 NOTE — Telephone Encounter (Signed)
Called and left patient a voice mail regarding referral for Hep C. July 6 is the next available with Dr. Luciana Axeomer. Patient has already had the majority of lab work needed. Anything else can be ordered the day of MD appointment. Claire MolaJacqueline Cockerham

## 2014-07-27 ENCOUNTER — Telehealth: Payer: Self-pay | Admitting: *Deleted

## 2014-07-27 NOTE — Telephone Encounter (Signed)
Called patient and left a voice mail to return my call regarding referral for Hep C. She has had all the necessary labs and can be scheduled an appt with Dr. Luciana Axeomer for July. Wendall MolaJacqueline Cockerham

## 2014-09-26 ENCOUNTER — Encounter: Payer: Self-pay | Admitting: Internal Medicine

## 2014-10-31 ENCOUNTER — Ambulatory Visit (INDEPENDENT_AMBULATORY_CARE_PROVIDER_SITE_OTHER): Payer: Self-pay | Admitting: Internal Medicine

## 2014-10-31 ENCOUNTER — Encounter: Payer: Self-pay | Admitting: Internal Medicine

## 2014-10-31 VITALS — BP 146/98 | HR 93 | Temp 98.2°F | Ht 65.0 in | Wt 185.0 lb

## 2014-10-31 DIAGNOSIS — Z9889 Other specified postprocedural states: Secondary | ICD-10-CM | POA: Insufficient documentation

## 2014-10-31 DIAGNOSIS — F411 Generalized anxiety disorder: Secondary | ICD-10-CM | POA: Insufficient documentation

## 2014-10-31 DIAGNOSIS — M2550 Pain in unspecified joint: Secondary | ICD-10-CM

## 2014-10-31 DIAGNOSIS — S42309A Unspecified fracture of shaft of humerus, unspecified arm, initial encounter for closed fracture: Secondary | ICD-10-CM | POA: Insufficient documentation

## 2014-10-31 DIAGNOSIS — I10 Essential (primary) hypertension: Secondary | ICD-10-CM | POA: Insufficient documentation

## 2014-10-31 DIAGNOSIS — B182 Chronic viral hepatitis C: Secondary | ICD-10-CM | POA: Insufficient documentation

## 2014-10-31 DIAGNOSIS — Z720 Tobacco use: Secondary | ICD-10-CM | POA: Insufficient documentation

## 2014-10-31 DIAGNOSIS — Z23 Encounter for immunization: Secondary | ICD-10-CM

## 2014-10-31 NOTE — Progress Notes (Signed)
HPI:  +Claire [name] is a 49 y.o. female who presents for initial evaluation and management of chronic hepatitis C.  Patient tested positive earlier this year in routine screening. Hepatitis C risk factors present are: prescription drug use including injecting one time years ago. Patient denies intranasal drug use, multiple sexual partners, renal dialysis, sexual contact with person with liver disease, tattoos. Patient has had other studies performed. Results: hepatitis C RNA by PCR, result: positive. Patient has not had prior treatment for Hepatitis C. Patient does not have a past history of liver disease. Patient does have a family history of liver disease.  A brother died of complications of alcoholic cirrhosis.      Patient does not have documented immunity to Hepatitis A. Patient does not have documented immunity to Hepatitis B.    Review of Systems:  Constitutional: Negative for fatigue, weight loss.  HENT: Negative for hearing loss, ear pain, neck pain, tinnitus and ear discharge.  Eyes: Negative for icterus, discharge and redness.  Respiratory: Negative fordypsnea, wheezing.  Cardiovascular: Negative for chest pain, palpitations, orthopnea, claudication and leg swelling.  Gastrointestinal: Negative for nausea, vomiting, abdominal distention and abdominal pain.Negative for  Genitourinary: Negative for dysuria, urgency, frequency, hematuria and flank pain.  Musculoskeletal: Negative for arthralgias, arthritis Skin: Negative for itching and rash. Neurological: Negative for dizziness and weakness. Endo/Heme/Allergies: Negative for environmental allergies and polydipsia. Does not bruise/bleed easily.      Past Medical History  Diagnosis Date  . Hypertension     Prior to Admission medications   Medication Sig Start Date End Date Taking? Authorizing Provider  ALPRAZolam Prudy Feeler) 0.5 MG tablet Take 0.5 mg by mouth 2 (two) times daily as needed for anxiety.   Yes Historical Provider,  MD  ibuprofen (ADVIL,MOTRIN) 200 MG tablet Take 600-800 mg by mouth every 6 (six) hours as needed for moderate pain.   Yes Historical Provider, MD  ibuprofen (ADVIL,MOTRIN) 600 MG tablet Take 1 tablet (600 mg total) by mouth every 6 (six) hours as needed. 11/13/13  Yes Tatyana Kirichenko, PA-C  HYDROcodone-acetaminophen (NORCO) 5-325 MG per tablet Take 1 tablet by mouth every 6 (six) hours as needed for moderate pain. Patient not taking: Reported on 10/31/2014 11/13/13   Jaynie Crumble, PA-C  oxyCODONE-acetaminophen (PERCOCET/ROXICET) 5-325 MG per tablet Take 1-2 tablets by mouth every 4 (four) hours as needed for moderate pain or severe pain. Patient not taking: Reported on 10/31/2014 06/26/13   Trixie Dredge, PA-C  traMADol (ULTRAM) 50 MG tablet Take 1 tablet (50 mg total) by mouth every 6 (six) hours as needed. Patient not taking: Reported on 10/31/2014 10/28/13   Jaynie Crumble, PA-C    Allergies  Allergen Reactions  . Toradol [Ketorolac Tromethamine] Nausea And Vomiting    Social History  Substance Use Topics  . Smoking status: Former Smoker -- 0.50 packs/day    Types: Cigarettes    Quit date: 02/24/1980  . Smokeless tobacco: Never Used  . Alcohol Use: No    FHx: brother with cirrhosis, no liver cancer in any family memebers   Objective:   Filed Vitals:   10/31/14 1344  BP: 146/98  Pulse: 93  Temp: 98.2 F (36.8 C)   GEN: in no apparent distress and alert HEENT: anicteric Cardiac: Cor RRR Lungs: clear Abdomen: Bowel sounds are normal, liver is not enlarged, spleen is not enlarged Ext: peripheral pulses normal, no pedal edema, no clubbing or cyanosis Skin: negative for - jaundice, spider hemangioma, telangiectasia, palmar erythema, ecchymosis and atrophy Musculoskeletal:no  joint swelling  Laboratory Genotype: No results found for: HCVGENOTYPE HCV viral load: No results found for: HCVQUANT Lab Results  Component Value Date   WBC 9.1 09/27/2012   HGB 12.1 09/27/2012    HCT 34.3* 09/27/2012   MCV 94.5 09/27/2012   PLT 179 09/27/2012    Lab Results  Component Value Date   CREATININE 1.03 09/27/2012   BUN 16 09/27/2012   NA 139 09/27/2012   K 4.0 09/27/2012   CL 104 09/27/2012   CO2 24 09/27/2012    Lab Results  Component Value Date   ALT 17 03/27/2012   AST 21 03/27/2012   ALKPHOS 90 03/27/2012     CHILD-PUGH A (INR not done)  5-6 points: Child class A 7-9 points: Child class B 10-15 points: Child class C  Lab Results  Component Value Date   BILITOT 0.2* 03/27/2012   ALBUMIN 3.7 03/27/2012    Encephalopathy None (1 point) Grade 1: Altered mood/confusion (2 points) Grade 2: Inappropriate behavior, impending stupor, somnolence (2 points) Grade 3: Markedly confused, stuporous but arousable (3 points) Grade 4: Comatose/unresponsive (3 points) Ascites Absent (1 point) Slight (2 points) Moderate (3 points) Bilirubin  < 2 mg/dL (1 point) 2-3 mg/dL (2 points) > 3 mg/dL (3 points) Albumin > 3.5 g/dL (1 point) 1.6-1.0 g/dL (2 points) < 2.8 g/dL (3 points) Prothrombin time prolongation Less than 4 seconds above control/INR < 1.7 (1 point) 4-6 seconds above control/INR 1.7-2.3 (2 points) More than 6 seconds above control/INR > 2.3 (3 points)   Assessment: Chronic Hepatitis C genotype 1a I discussed with the patient the natural history and progression of chronic hepatitis C infection including about 30% of people who develop cirrhosis of the liver and once cirrhosis is established there is a 2-7% risk per year of liver cancer and liver failure.    Plan: 1) Patient counseled extensively on limiting acetaminophen to no more than 2 grams daily, avoidance of alcohol. 2) Transmission discussed with patient including sexual transmission, sharing razors and toothbrush.   3) Will need referral to gastroenterology if concern for cirrhosis 4) Will need referral for substance abuse counseling: Yes.   5) Will prescribe Zepatier through  [facility] for 12 or 16 weeks pending NS5A testing.  6) Hepatitis A vaccine Yes.   7) Hepatitis B vaccine Yes.   8) Pneumovax vaccine if concern for cirrhosis 9) will follow up after elastography

## 2014-11-27 ENCOUNTER — Ambulatory Visit (HOSPITAL_COMMUNITY)
Admission: RE | Admit: 2014-11-27 | Discharge: 2014-11-27 | Disposition: A | Discharge status: Home / Self Care | Payer: Self-pay | Source: Ambulatory Visit | Attending: Internal Medicine | Admitting: Internal Medicine

## 2014-11-27 ENCOUNTER — Ambulatory Visit: Payer: Self-pay | Admitting: *Deleted

## 2014-11-27 DIAGNOSIS — B182 Chronic viral hepatitis C: Secondary | ICD-10-CM | POA: Insufficient documentation

## 2014-11-30 LAB — HCV RNA NS5A DRUG RESISTANCE

## 2014-12-13 ENCOUNTER — Encounter: Payer: Self-pay | Admitting: Internal Medicine

## 2014-12-13 ENCOUNTER — Ambulatory Visit (INDEPENDENT_AMBULATORY_CARE_PROVIDER_SITE_OTHER): Payer: Self-pay | Admitting: Internal Medicine

## 2014-12-13 VITALS — BP 143/93 | HR 89 | Temp 97.6°F | Wt 179.0 lb

## 2014-12-13 DIAGNOSIS — K7400 Hepatic fibrosis, unspecified: Secondary | ICD-10-CM

## 2014-12-13 DIAGNOSIS — Z23 Encounter for immunization: Secondary | ICD-10-CM

## 2014-12-13 DIAGNOSIS — M549 Dorsalgia, unspecified: Secondary | ICD-10-CM | POA: Insufficient documentation

## 2014-12-13 DIAGNOSIS — B182 Chronic viral hepatitis C: Secondary | ICD-10-CM

## 2014-12-13 MED ORDER — ELBASVIR-GRAZOPREVIR 50-100 MG PO TABS: 1.0000 | ORAL_TABLET | Freq: Every day | ORAL | Status: DC

## 2014-12-13 NOTE — Assessment & Plan Note (Signed)
Will try to get her Zepatier thorugh [facility].  Form signed today.  RTC labs in 4 weeks and with pharmacist in 5 weeks.  Then can fu after treatment with viral load.

## 2014-12-13 NOTE — Assessment & Plan Note (Addendum)
I will send to GI and see if she can be seen locally for ?EGD.   Pneumovax next visit.

## 2014-12-13 NOTE — Assessment & Plan Note (Addendum)
Ok to take up to 2 grams of acetaminophen, tramadol or other opiod per her PCP, if indicated.

## 2014-12-13 NOTE — Progress Notes (Signed)
   Subjective:    Patient ID: Claire [name], female    DOB: 03/29/1965, 49 y.o.   MRN: [id number]  HPI Here for follow up of HCV.  Genotype 1a, viral load noted from outside lab, elastography with F3/4.  No alcohol.  Some back pain as well.  Went to see her PCP who wanted to verify if there are any issues with any pain medications.     Review of Systems  Constitutional: Negative for fatigue.  Gastrointestinal: Negative for diarrhea.  Musculoskeletal: Positive for back pain.  Skin: Negative for rash.       Objective:   Physical Exam  Constitutional: She appears well-developed and well-nourished. No distress.  Eyes: No scleral icterus.  Cardiovascular: Normal rate, regular rhythm and normal heart sounds.   No murmur heard. Pulmonary/Chest: Effort normal and breath sounds normal. No respiratory distress.  Skin: No rash noted.          Assessment & Plan:

## 2014-12-20 ENCOUNTER — Telehealth: Payer: Self-pay | Admitting: *Deleted

## 2014-12-20 NOTE — Telephone Encounter (Signed)
Called the patient to advise her that her Hep C medication is ready to pick up.

## 2014-12-25 ENCOUNTER — Telehealth: Payer: Self-pay | Admitting: Pharmacy Technician

## 2014-12-27 NOTE — Progress Notes (Unsigned)
Patient ID: Claire [name], female   DOB: 08/30/1965, 49 y.o.   MRN: [id number]   HPI: Claire [name] is a 49 y.o. female who presents for Hep C management.  HCV Genotype: 1a  Allergies: Allergies  Allergen Reactions  . Toradol [Ketorolac Tromethamine] Nausea And Vomiting    Vitals:    Past Medical History: Past Medical History  Diagnosis Date  . Hypertension     Social History: Social History   Social History  . Marital Status: Legally Separated    Spouse Name: N/A  . Number of Children: N/A  . Years of Education: N/A   Social History Main Topics  . Smoking status: Current Every Day Smoker -- 0.50 packs/day    Types: Cigarettes    Last Attempt to Quit: 02/24/1980  . Smokeless tobacco: Never Used  . Alcohol Use: No  . Drug Use: No  . Sexual Activity: Not on file   Other Topics Concern  . Not on file   Social History Narrative    Labs: No results found for: HIV1RNAQUANT, HIV1RNAVL, CD4TABS, HEPBSAB, HEPBSAG, HCVAB  No results found for: HCVGENOTYPE, HEPCGENOTYPE  No flowsheet data found.  AST  Date Value  03/27/2012 21 U/L  01/10/2008 23   ALT  Date Value  03/27/2012 17 U/L  01/10/2008 12    CrCl: CrCl cannot be calculated (Unknown ideal weight.).  Fibrosis Score: F3/F4 as assessed by ultrasound with elastography.   Child-Pugh Score: A  Previous Treatment Regimen: None  Assessment: [name] is a 49 yo F who presents for Zepatier initiation. Today she is here to pick up her medication from the clinic. She was very grateful to be able to get treatment for her Hepatitis C. She states does not use illicit drugs or drink alcohol. We educated her on proper use of the medication and potential side effects. She is excited about starting treatment.  Recommendations: - Start Zepatier today 12/27/14 - Anticipated duration of treatment is 12 weeks because she is NS5A negative - Follow up in 4 weeks for labs  [place] P Stone, Pharm.D PGY-1 Pharmacy  Resident 12/27/2014, 4:04 PM

## 2014-12-28 ENCOUNTER — Encounter: Payer: Self-pay | Admitting: Pharmacy Technician

## 2015-01-02 NOTE — Progress Notes (Signed)
We will contact her and range for a screening EGD - evaluate for varices

## 2015-01-09 ENCOUNTER — Telehealth: Payer: Self-pay

## 2015-01-09 NOTE — Telephone Encounter (Signed)
Patient calling to say she is feeling so sick from the Hep. C medications and is not able to work and [employer] will be sending forms for us to complete. I left message on her machine to call the office.  We will need more information.    Laurell Josephsammy K King, RN

## 2015-01-10 ENCOUNTER — Other Ambulatory Visit: Payer: Self-pay

## 2015-01-10 ENCOUNTER — Ambulatory Visit: Payer: Self-pay

## 2015-01-10 NOTE — Telephone Encounter (Addendum)
Dr. Luciana Axeomer did not fill out the paperwork and has recommended that she give it to her PCP. Spoke to patient and she is aware; she did not need it back she said she has a copy.  Claire MolaJacqueline Cockerham

## 2015-01-10 NOTE — Telephone Encounter (Signed)
[name] [employer] faxing paperwork for Dr. Luciana Axeomer to complete for "State and Federal Work Programs."  This paperwork was received and given to Dr. Luciana Axeomer for his completion.  A copy of paperwork was made and is located in [facility] Triage.  Pt has not returned call from Kennedy Bucker. King, RN at this time.

## 2015-01-15 ENCOUNTER — Encounter: Payer: Self-pay | Admitting: Internal Medicine

## 2015-01-24 ENCOUNTER — Other Ambulatory Visit: Payer: Self-pay

## 2015-01-24 DIAGNOSIS — B182 Chronic viral hepatitis C: Secondary | ICD-10-CM

## 2015-01-24 LAB — CBC WITH DIFFERENTIAL/PLATELET
Basophils Absolute: 0 10*3/uL (ref 0.0–0.1)
Basophils Relative: 0 % (ref 0–1)
Eosinophils Absolute: 0.1 10*3/uL (ref 0.0–0.7)
Eosinophils Relative: 1 % (ref 0–5)
HCT: 38.7 % (ref 36.0–46.0)
Hemoglobin: 13.6 g/dL (ref 12.0–15.0)
Lymphocytes Relative: 26 % (ref 12–46)
Lymphs Abs: 2 10*3/uL (ref 0.7–4.0)
MCH: 33.3 pg (ref 26.0–34.0)
MCHC: 35.1 g/dL (ref 30.0–36.0)
MCV: 94.6 fL (ref 78.0–100.0)
MPV: 10.5 fL (ref 8.6–12.4)
Monocytes Absolute: 0.3 10*3/uL (ref 0.1–1.0)
Monocytes Relative: 4 % (ref 3–12)
Neutro Abs: 5.4 10*3/uL (ref 1.7–7.7)
Neutrophils Relative %: 69 % (ref 43–77)
Platelets: 187 10*3/uL (ref 150–400)
RBC: 4.09 MIL/uL (ref 3.87–5.11)
RDW: 13 % (ref 11.5–15.5)
WBC: 7.8 10*3/uL (ref 4.0–10.5)

## 2015-01-24 LAB — COMPLETE METABOLIC PANEL WITH GFR
ALT: 11 U/L (ref 6–29)
AST: 19 U/L (ref 10–35)
Albumin: 4.2 g/dL (ref 3.6–5.1)
Alkaline Phosphatase: 78 U/L (ref 33–115)
BUN: 6 mg/dL — ABNORMAL LOW (ref 7–25)
CO2: 26 mmol/L (ref 20–31)
Calcium: 9.8 mg/dL (ref 8.6–10.2)
Chloride: 100 mmol/L (ref 98–110)
Creat: 0.75 mg/dL (ref 0.50–1.10)
GFR, Est African American: >89 mL/min (ref >=60)
GFR, Est Non African American: >89 mL/min (ref >=60)
Glucose, Bld: 123 mg/dL — ABNORMAL HIGH (ref 65–99)
Potassium: 3.4 mmol/L — ABNORMAL LOW (ref 3.5–5.3)
Sodium: 138 mmol/L (ref 135–146)
Total Bilirubin: 0.8 mg/dL (ref 0.2–1.2)
Total Protein: 7.9 g/dL (ref 6.1–8.1)

## 2015-01-27 LAB — HEPATITIS C RNA QUANTITATIVE
HCV Quantitative Log: <1.18 {Log} (ref <1.18)
HCV Quantitative: <15 IU/mL (ref <15)

## 2015-04-11 NOTE — Telephone Encounter (Signed)
She will pick up med 12/25/14 and have follow up appointment with [facility] 01/10/15

## 2015-08-01 ENCOUNTER — Emergency Department (HOSPITAL_COMMUNITY): Payer: Self-pay

## 2015-08-01 ENCOUNTER — Emergency Department (HOSPITAL_COMMUNITY)
Admission: EM | Admit: 2015-08-01 | Discharge: 2015-08-01 | Discharge status: Against Medical Advice | Payer: Self-pay | Attending: Emergency Medicine | Admitting: Emergency Medicine

## 2015-08-01 ENCOUNTER — Encounter (HOSPITAL_COMMUNITY): Payer: Self-pay | Admitting: Emergency Medicine

## 2015-08-01 DIAGNOSIS — F1110 Opioid abuse, uncomplicated: Secondary | ICD-10-CM

## 2015-08-01 DIAGNOSIS — I10 Essential (primary) hypertension: Secondary | ICD-10-CM | POA: Insufficient documentation

## 2015-08-01 DIAGNOSIS — F17210 Nicotine dependence, cigarettes, uncomplicated: Secondary | ICD-10-CM | POA: Insufficient documentation

## 2015-08-01 LAB — COMPREHENSIVE METABOLIC PANEL
ALT: 10 U/L — ABNORMAL LOW (ref 14–54)
AST: 18 U/L (ref 15–41)
Albumin: 4.3 g/dL (ref 3.5–5.0)
Alkaline Phosphatase: 76 U/L (ref 38–126)
Anion gap: 7 (ref 5–15)
BUN: 18 mg/dL (ref 6–20)
CO2: 26 mmol/L (ref 22–32)
Calcium: 8.9 mg/dL (ref 8.9–10.3)
Chloride: 105 mmol/L (ref 101–111)
Creatinine, Ser: 1.01 mg/dL — ABNORMAL HIGH (ref 0.44–1.00)
GFR calc Af Amer: >60 mL/min (ref >60)
GFR calc non Af Amer: >60 mL/min (ref >60)
Glucose, Bld: 112 mg/dL — ABNORMAL HIGH (ref 65–99)
Potassium: 3.4 mmol/L — ABNORMAL LOW (ref 3.5–5.1)
Sodium: 138 mmol/L (ref 135–145)
Total Bilirubin: 0.6 mg/dL (ref 0.3–1.2)
Total Protein: 7.4 g/dL (ref 6.5–8.1)

## 2015-08-01 LAB — CBC WITH DIFFERENTIAL/PLATELET
Basophils Absolute: 0 10*3/uL (ref 0.0–0.1)
Basophils Relative: 0 %
Eosinophils Absolute: 0.2 10*3/uL (ref 0.0–0.7)
Eosinophils Relative: 2 %
HCT: 37 % (ref 36.0–46.0)
Hemoglobin: 12.8 g/dL (ref 12.0–15.0)
Lymphocytes Relative: 28 %
Lymphs Abs: 2.4 10*3/uL (ref 0.7–4.0)
MCH: 33 pg (ref 26.0–34.0)
MCHC: 34.6 g/dL (ref 30.0–36.0)
MCV: 95.4 fL (ref 78.0–100.0)
Monocytes Absolute: 0.5 10*3/uL (ref 0.1–1.0)
Monocytes Relative: 6 %
Neutro Abs: 5.3 10*3/uL (ref 1.7–7.7)
Neutrophils Relative %: 64 %
Platelets: 160 10*3/uL (ref 150–400)
RBC: 3.88 MIL/uL (ref 3.87–5.11)
RDW: 11.9 % (ref 11.5–15.5)
WBC: 8.4 10*3/uL (ref 4.0–10.5)

## 2015-08-01 LAB — SALICYLATE LEVEL: Salicylate Lvl: <4 mg/dL (ref 2.8–30.0)

## 2015-08-01 LAB — ETHANOL: Alcohol, Ethyl (B): <5 mg/dL (ref <5)

## 2015-08-01 LAB — ACETAMINOPHEN LEVEL: Acetaminophen (Tylenol), Serum: 23 ug/mL (ref 10–30)

## 2015-08-01 LAB — CBG MONITORING, ED: Glucose-Capillary: 321 mg/dL — ABNORMAL HIGH (ref 65–99)

## 2015-08-01 MED ORDER — NALOXONE HCL 0.4 MG/ML IJ SOLN
0.4000 mg | Freq: Once | INTRAMUSCULAR | Status: AC
  Administered 2015-08-01: 0.4 mg via INTRAVENOUS
  Filled 2015-08-01: qty 1
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
## 2015-08-01 NOTE — ED Notes (Signed)
Went in to pt's room to check on pt. Pt pulled cardiac leads and BP cuff off. Pt screaming she was going to leave. Pt pulled IV out and threw it in the floor. Pt allowed RN to stop bleeding. Bandage placed. Dr. Manus Gunningancour informed of pt's status, to pt's room. Pt refusing to stay, refused to sign out. Pt informed of CBG, asked to stay for treatment, pt refused. Pt left AMA.

## 2015-08-01 NOTE — ED Notes (Signed)
Pt CBG was 321
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
## 2015-09-10 ENCOUNTER — Encounter (HOSPITAL_COMMUNITY): Payer: Self-pay | Admitting: Emergency Medicine

## 2015-09-10 ENCOUNTER — Emergency Department (HOSPITAL_COMMUNITY)
Admission: EM | Admit: 2015-09-10 | Discharge: 2015-09-10 | Disposition: A | Discharge status: Home / Self Care | Payer: Self-pay | Attending: Emergency Medicine | Admitting: Emergency Medicine

## 2015-09-10 DIAGNOSIS — I10 Essential (primary) hypertension: Secondary | ICD-10-CM | POA: Insufficient documentation

## 2015-09-10 DIAGNOSIS — F17210 Nicotine dependence, cigarettes, uncomplicated: Secondary | ICD-10-CM | POA: Insufficient documentation

## 2015-09-10 DIAGNOSIS — K047 Periapical abscess without sinus: Secondary | ICD-10-CM | POA: Insufficient documentation

## 2015-09-10 MED ORDER — AMOXICILLIN 250 MG PO CAPS
500.0000 mg | ORAL_CAPSULE | Freq: Once | ORAL | Status: AC
  Administered 2015-09-10: 500 mg via ORAL
  Filled 2015-09-10: qty 2

## 2015-09-10 MED ORDER — AMOXICILLIN 500 MG PO CAPS: 500.0000 mg | ORAL_CAPSULE | Freq: Three times a day (TID) | ORAL | Status: AC

## 2015-09-10 MED ORDER — TRAMADOL HCL 50 MG PO TABS
50.0000 mg | ORAL_TABLET | Freq: Once | ORAL | Status: AC
  Administered 2015-09-10: 50 mg via ORAL
  Filled 2015-09-10: qty 1

## 2015-09-10 NOTE — ED Notes (Signed)
PT c/o left upper dental pain x2 days with history of broken tooth in same area.

## 2015-09-10 NOTE — Discharge Instructions (Signed)

## 2015-09-10 NOTE — ED Provider Notes (Signed)
CSN: [id number]     Arrival date & time 09/10/15  1420 History   First MD Initiated Contact with Patient 09/10/15 1440     Chief Complaint  Patient presents with  . Dental Pain     (Consider location/radiation/quality/duration/timing/severity/associated sxs/prior Treatment) The history is provided by the patient.   Claire [name] is a 50 y.o. female presenting with a 2 day history of dental pain and gingival swelling.   The patient has a history of dental infection and decay,  which has recently started to cause increased  Pain and swelling..  There has been no fevers, chills, nausea or vomiting, also no complaint of difficulty swallowing, although chewing makes pain worse.  The patient has tried no treatments prior to arrival.     Past Medical History  Diagnosis Date  . Hypertension    Past Surgical History  Procedure Laterality Date  . Cesarean section    . Cholecystectomy    . Heel spur surgery    . Ankle fracture surgery    . Abdominal hysterectomy    . Arm surgery Right    History reviewed. No pertinent family history. Social History  Substance Use Topics  . Smoking status: Current Every Day Smoker -- 0.50 packs/day    Types: Cigarettes  . Smokeless tobacco: Never Used  . Alcohol Use: No   OB History    Gravida Para Term Preterm AB TAB SAB Ectopic Multiple Living   Review of Systems  Constitutional: Negative for fever and chills.  HENT: Positive for dental problem. Negative for facial swelling and sore throat.   Respiratory: Negative for shortness of breath.   Musculoskeletal: Negative for neck pain and neck stiffness.      Allergies  Toradol  Home Medications   Prior to Admission medications   Medication Sig Start Date End Date Taking? Authorizing Provider  amoxicillin (AMOXIL) 500 MG capsule Take 1 capsule (500 mg total) by mouth 3 (three) times daily. 09/10/15 09/20/15  Burgess Amor, PA-C  Elbasvir-Grazoprevir (ZEPATIER) 50-100 MG TABS  Take 1 tablet by mouth daily. Patient not taking: Reported on 09/10/2015 12/13/14   Gardiner Barefoot, MD  HYDROcodone-acetaminophen [facility]) 5-325 MG per tablet Take 1 tablet by mouth every 6 (six) hours as needed for moderate pain. Patient not taking: Reported on 10/31/2014 11/13/13   Tatyana Kirichenko, PA-C  ibuprofen (ADVIL,MOTRIN) 600 MG tablet Take 1 tablet (600 mg total) by mouth every 6 (six) hours as needed. Patient not taking: Reported on 09/10/2015 11/13/13   Jaynie Crumble, PA-C  oxyCODONE-acetaminophen (PERCOCET/ROXICET) 5-325 MG per tablet Take 1-2 tablets by mouth every 4 (four) hours as needed for moderate pain or severe pain. Patient not taking: Reported on 10/31/2014 06/26/13   Trixie Dredge, PA-C  traMADol (ULTRAM) 50 MG tablet Take 1 tablet (50 mg total) by mouth every 6 (six) hours as needed. Patient not taking: Reported on 10/31/2014 10/28/13   Tatyana Kirichenko, PA-C   BP 167/105 mmHg  Pulse 92  Temp(Src) 98.2 F (36.8 C) (Oral)  Resp 18  Ht  (1.651 m)  Wt 76.204 kg  BMI 27.96 kg/m2  SpO2 100% Physical Exam  Constitutional: She appears well-developed and well-nourished.  HENT:  Head: Normocephalic.  Mouth/Throat: Uvula is midline. Abnormal dentition. Dental caries present.  Partially edentulous, all molars have been extracted.  She wears a partial plate on the upper.  Her left lateral incisor is fractured to  the gingival line.  There is surrounding erythema and swelling around this tooth.  There is no palpable pus pockets or fluctuance.  There is no facial edema, induration or erythema.  Eyes: Conjunctivae are normal.  Neck: Normal range of motion. Neck supple.  Cardiovascular: Normal rate and intact distal pulses.   Pedal pulses normal.  Pulmonary/Chest: Effort normal.  Abdominal: Soft. Bowel sounds are normal. She exhibits no distension and no mass.  Musculoskeletal: Normal range of motion. She exhibits no edema.       Lumbar back: She exhibits tenderness. She exhibits  no swelling, no edema and no spasm.  Neurological: She is alert. She has normal strength. She displays no atrophy and no tremor. No sensory deficit. Gait normal.  Reflex Scores:      Patellar reflexes are 2+ on the right side and 2+ on the left side.      Achilles reflexes are 2+ on the right side and 2+ on the left side. No strength deficit noted in hip and knee flexor and extensor muscle groups.  Ankle flexion and extension intact.  Skin: Skin is warm and dry.  Psychiatric: She has a normal mood and affect.  Nursing note and vitals reviewed.   ED Course  Procedures (including critical care time) Labs Review Labs Reviewed - No data to display  Imaging Review No results found. I have personally reviewed and evaluated these images and lab results as part of my medical decision-making.   EKG Interpretation None      MDM   Final diagnoses:  Dental infection    Prior chart reviewed, pt here last month for opioid abuse.  She does have significant dental problems, however.  She was started on amoxil.  Advised cannot prescribe narcotics. She was given one tramadol here. Advised motrin for additional pain relief prn.  Dental referrals given.       Burgess AmorJulie Idol, PA-C 09/10/15 1509  Vanetta MuldersScott Zackowski, MD 09/10/15 [phone]

## 2015-10-22 DIAGNOSIS — Z139 Encounter for screening, unspecified: Secondary | ICD-10-CM

## 2015-10-23 ENCOUNTER — Ambulatory Visit: Payer: Self-pay | Admitting: Physician Assistant

## 2015-10-23 ENCOUNTER — Encounter: Payer: Self-pay | Admitting: Physician Assistant

## 2015-10-23 VITALS — BP 150/96 | HR 85 | Temp 97.9°F | Ht 64.25 in | Wt 172.0 lb

## 2015-10-23 DIAGNOSIS — Z131 Encounter for screening for diabetes mellitus: Secondary | ICD-10-CM

## 2015-10-23 DIAGNOSIS — Z8619 Personal history of other infectious and parasitic diseases: Secondary | ICD-10-CM

## 2015-10-23 DIAGNOSIS — F1911 Other psychoactive substance abuse, in remission: Secondary | ICD-10-CM | POA: Insufficient documentation

## 2015-10-23 DIAGNOSIS — I10 Essential (primary) hypertension: Secondary | ICD-10-CM

## 2015-10-23 DIAGNOSIS — F17210 Nicotine dependence, cigarettes, uncomplicated: Secondary | ICD-10-CM

## 2015-10-23 DIAGNOSIS — Z13220 Encounter for screening for lipoid disorders: Secondary | ICD-10-CM

## 2015-10-23 DIAGNOSIS — Z1239 Encounter for other screening for malignant neoplasm of breast: Secondary | ICD-10-CM

## 2015-10-23 LAB — GLUCOSE, POCT (MANUAL RESULT ENTRY): POC Glucose: 96 mg/dl (ref 70–99)

## 2015-10-23 MED ORDER — LISINOPRIL 10 MG PO TABS: 10.0000 mg | ORAL_TABLET | Freq: Every day | ORAL | 1 refills | Status: DC

## 2015-10-23 NOTE — Progress Notes (Signed)
BP (!) 150/96 (BP Location: Left Arm, Patient Position: Sitting, Cuff Size: Normal)   Pulse 85   Temp 97.9 F (36.6 C)   Ht 5' 4.25" (1.632 m)   Wt 172 lb (78 kg)   SpO2 98%   BMI 29.29 kg/m    Subjective:    Patient ID: Claire [name], female    DOB: 1965/06/10, 50 y.o.   MRN: [id number]  HPI: Claire [name] is a 50 y.o. female presenting on 10/23/2015 for New Patient (Initial Visit) (pt trying to get into [facility]. pt previous patient at [facility] in [place])   HPI   Pt is set to move into [facility] house today after her appointment here.    Pt is being seen at [facility]. Last seen there in July.  She is planning to return there for counseling.  Pt states she took lisinopril in the past for her bp but hasn't been on it since 2012  She has Never had a mammogram  Pt states she had treatment for hepatitis C  Relevant past medical, surgical, family and social history reviewed and updated as indicated. Interim medical history since our last visit reviewed. Allergies and medications reviewed and updated.  No current outpatient prescriptions on file.  Review of Systems  Constitutional: Negative for appetite change, chills, diaphoresis, fatigue, fever and unexpected weight change.  HENT: Positive for dental problem. Negative for congestion, drooling, ear pain, facial swelling, hearing loss, mouth sores, sneezing, sore throat, trouble swallowing and voice change.   Eyes: Negative for pain, discharge, redness, itching and visual disturbance.  Respiratory: Negative for cough, choking, shortness of breath and wheezing.   Cardiovascular: Negative for chest pain, palpitations and leg swelling.  Gastrointestinal: Negative for abdominal pain, blood in stool, constipation, diarrhea and vomiting.  Endocrine: Negative for cold intolerance, heat intolerance and polydipsia.  Genitourinary: Negative for decreased urine volume, dysuria and hematuria.  Musculoskeletal: Negative for  arthralgias, back pain and gait problem.  Skin: Negative for rash.  Allergic/Immunologic: Negative for environmental allergies.  Neurological: Negative for seizures, syncope, light-headedness and headaches.  Hematological: Negative for adenopathy.  Psychiatric/Behavioral: Negative for agitation, dysphoric mood and suicidal ideas. The patient is not nervous/anxious.     Per HPI unless specifically indicated above     Objective:    BP (!) 150/96 (BP Location: Left Arm, Patient Position: Sitting, Cuff Size: Normal)   Pulse 85   Temp 97.9 F (36.6 C)   Ht 5' 4.25" (1.632 m)   Wt 172 lb (78 kg)   SpO2 98%   BMI 29.29 kg/m   Wt Readings from Last 3 Encounters:  10/23/15 172 lb (78 kg)  09/10/15 168 lb (76.2 kg)  08/01/15 160 lb (72.6 kg)    Physical Exam  Constitutional: She is oriented to person, place, and time. She appears well-developed and well-nourished.  HENT:  Head: Normocephalic and atraumatic.  Mouth/Throat: Oropharynx is clear and moist. No oropharyngeal exudate.  Eyes: Conjunctivae and EOM are normal. Pupils are equal, round, and reactive to light.  Neck: Neck supple. No thyromegaly present.  Cardiovascular: Normal rate and regular rhythm.   Pulmonary/Chest: Effort normal and breath sounds normal.  Abdominal: Soft. Bowel sounds are normal. She exhibits no mass. There is no hepatosplenomegaly. There is no tenderness.  Musculoskeletal: She exhibits no edema.  Lymphadenopathy:    She has no cervical adenopathy.  Neurological: She is alert and oriented to person, place, and time. Gait normal.  Skin: Skin is warm and  dry.  Psychiatric: She has a normal mood and affect. Her behavior is normal.  Vitals reviewed.   Results for orders placed or performed in visit on 10/23/15  POCT Glucose (CBG)  Result Value Ref Range   POC Glucose 96 70 - 99 mg/dl      Assessment & Plan:    Encounter Diagnoses  Name Primary?  . HTN (hypertension), benign Yes  . Substance abuse  in remission   . Screening for diabetes mellitus (DM)   . Screening cholesterol level   . Cigarette nicotine dependence without complication   . History of hepatitis C   . Screening for breast cancer    -order screening mammogram -get baseline Labs -restart lisinopril at 10mg  daily -[facility] and given to pt -F/u 1 month.  RTO sooner prn
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
## 2015-10-25 LAB — COMPREHENSIVE METABOLIC PANEL
ALT: 7 U/L (ref 6–29)
AST: 12 U/L (ref 10–35)
Albumin: 4.3 g/dL (ref 3.6–5.1)
Alkaline Phosphatase: 73 U/L (ref 33–115)
BUN: 16 mg/dL (ref 7–25)
CO2: 26 mmol/L (ref 20–31)
Calcium: 9.4 mg/dL (ref 8.6–10.2)
Chloride: 107 mmol/L (ref 98–110)
Creat: 0.89 mg/dL (ref 0.50–1.10)
Glucose, Bld: 84 mg/dL (ref 65–99)
Potassium: 4.6 mmol/L (ref 3.5–5.3)
Sodium: 140 mmol/L (ref 135–146)
Total Bilirubin: 0.6 mg/dL (ref 0.2–1.2)
Total Protein: 7.1 g/dL (ref 6.1–8.1)

## 2015-10-25 LAB — LIPID PANEL
Cholesterol: 189 mg/dL (ref 125–200)
HDL: 56 mg/dL (ref >=46)
LDL Cholesterol: 113 mg/dL (ref <130)
Total CHOL/HDL Ratio: 3.4 Ratio (ref <=5.0)
Triglycerides: 102 mg/dL (ref <150)
VLDL: 20 mg/dL (ref <30)

## 2015-10-26 LAB — HEMOGLOBIN A1C
Hgb A1c MFr Bld: 5.3 % (ref <5.7)
Mean Plasma Glucose: 105 mg/dL

## 2015-11-04 ENCOUNTER — Other Ambulatory Visit: Payer: Self-pay | Admitting: Physician Assistant

## 2015-11-04 DIAGNOSIS — Z1231 Encounter for screening mammogram for malignant neoplasm of breast: Secondary | ICD-10-CM

## 2015-11-11 ENCOUNTER — Encounter (HOSPITAL_COMMUNITY): Payer: Self-pay

## 2015-11-14 ENCOUNTER — Ambulatory Visit (HOSPITAL_COMMUNITY)
Admission: RE | Admit: 2015-11-14 | Discharge: 2015-11-14 | Disposition: A | Discharge status: Home / Self Care | Payer: Self-pay | Source: Ambulatory Visit | Attending: Physician Assistant | Admitting: Physician Assistant

## 2015-11-14 DIAGNOSIS — Z1231 Encounter for screening mammogram for malignant neoplasm of breast: Secondary | ICD-10-CM

## 2015-11-21 ENCOUNTER — Ambulatory Visit: Payer: Self-pay | Admitting: Physician Assistant

## 2015-11-21 ENCOUNTER — Encounter: Payer: Self-pay | Admitting: Physician Assistant

## 2015-11-21 VITALS — BP 118/80 | HR 83 | Temp 97.7°F | Ht 64.25 in | Wt 177.6 lb

## 2015-11-21 DIAGNOSIS — I10 Essential (primary) hypertension: Secondary | ICD-10-CM

## 2015-11-21 DIAGNOSIS — F17210 Nicotine dependence, cigarettes, uncomplicated: Secondary | ICD-10-CM

## 2015-11-21 NOTE — Progress Notes (Signed)
BP 118/80 (BP Location: Left Arm, Patient Position: Sitting, Cuff Size: Normal)   Pulse 83   Temp 97.7 F (36.5 C)   Ht 5' 4.25" (1.632 m)   Wt 177 lb 9.6 oz (80.6 kg)   SpO2 99%   BMI 30.25 kg/m    Subjective:    Patient ID: Claire [name], female    DOB: [birth year], 50 y.o.   MRN: [id number]  HPI: Claire [name] is a 50 y.o. female presenting on 11/21/2015 for Hypertension   HPI    Pt doing well living at [facility] house. No complaints today  Relevant past medical, surgical, family and social history reviewed and updated as indicated. Interim medical history since our last visit reviewed. Allergies and medications reviewed and updated.   Current Outpatient Prescriptions:  .  citalopram (CELEXA) 20 MG tablet, Take 20 mg by mouth daily., Disp: , Rfl:  .  lisinopril (PRINIVIL,ZESTRIL) 10 MG tablet, Take 1 tablet (10 mg total) by mouth daily., Disp: 30 tablet, Rfl: 1 .  traZODone (DESYREL) 100 MG tablet, Take 100 mg by mouth at bedtime., Disp: , Rfl:    Review of Systems  Constitutional: Positive for unexpected weight change. Negative for appetite change, chills, diaphoresis, fatigue and fever.  HENT: Positive for dental problem. Negative for congestion, drooling, ear pain, facial swelling, hearing loss, mouth sores, sneezing, sore throat, trouble swallowing and voice change.   Eyes: Negative for pain, discharge, redness, itching and visual disturbance.  Respiratory: Negative for cough, choking, shortness of breath and wheezing.   Cardiovascular: Negative for chest pain, palpitations and leg swelling.  Gastrointestinal: Negative for abdominal pain, blood in stool, constipation, diarrhea and vomiting.  Endocrine: Negative for cold intolerance, heat intolerance and polydipsia.  Genitourinary: Negative for decreased urine volume, dysuria and hematuria.  Musculoskeletal: Negative for arthralgias, back pain and gait problem.  Skin: Negative for rash.  Allergic/Immunologic: Negative  for environmental allergies.  Neurological: Positive for headaches. Negative for seizures, syncope and light-headedness.  Hematological: Negative for adenopathy.  Psychiatric/Behavioral: Negative for agitation, dysphoric mood and suicidal ideas. The patient is not nervous/anxious.     Per HPI unless specifically indicated above     Objective:    BP 118/80 (BP Location: Left Arm, Patient Position: Sitting, Cuff Size: Normal)   Pulse 83   Temp 97.7 F (36.5 C)   Ht 5' 4.25" (1.632 m)   Wt 177 lb 9.6 oz (80.6 kg)   SpO2 99%   BMI 30.25 kg/m   Wt Readings from Last 3 Encounters:  11/21/15 177 lb 9.6 oz (80.6 kg)  10/23/15 172 lb (78 kg)  09/10/15 168 lb (76.2 kg)    Physical Exam  Constitutional: She is oriented to person, place, and time. She appears well-developed and well-nourished.  HENT:  Head: Normocephalic and atraumatic.  Neck: Neck supple.  Cardiovascular: Normal rate and regular rhythm.   Pulmonary/Chest: Effort normal and breath sounds normal.  Abdominal: Soft. Bowel sounds are normal. She exhibits no mass. There is no hepatosplenomegaly. There is no tenderness.  Musculoskeletal: She exhibits no edema.  Lymphadenopathy:    She has no cervical adenopathy.  Neurological: She is alert and oriented to person, place, and time.  Skin: Skin is warm and dry.  Psychiatric: She has a normal mood and affect. Her behavior is normal.  Vitals reviewed.   Results for orders placed or performed in visit on 10/23/15  Lipid Profile  Result Value Ref Range   Cholesterol 189 125 - 200 mg/dL  Triglycerides 102 <150 mg/dL   HDL 56 >=16>=46 mg/dL   Total CHOL/HDL Ratio 3.4 <=5.0 Ratio   VLDL 20 <30 mg/dL   LDL Cholesterol 109113 <604<130 mg/dL  Comprehensive Metabolic Panel (CMET)  Result Value Ref Range   Sodium 140 135 - 146 mmol/L   Potassium 4.6 3.5 - 5.3 mmol/L   Chloride 107 98 - 110 mmol/L   CO2 26 20 - 31 mmol/L   Glucose, Bld 84 65 - 99 mg/dL   BUN 16 7 - 25 mg/dL   Creat  5.400.89 9.810.50 - 1.911.10 mg/dL   Total Bilirubin 0.6 0.2 - 1.2 mg/dL   Alkaline Phosphatase 73 33 - 115 U/L   AST 12 10 - 35 U/L   ALT 7 6 - 29 U/L   Total Protein 7.1 6.1 - 8.1 g/dL   Albumin 4.3 3.6 - 5.1 g/dL   Calcium 9.4 8.6 - 47.810.2 mg/dL  HgB G9FA1c  Result Value Ref Range   Hgb A1c MFr Bld 5.3 <5.7 %   Mean Plasma Glucose 105 mg/dL  POCT Glucose (CBG)  Result Value Ref Range   POC Glucose 96 70 - 99 mg/dl      Assessment & Plan:   Encounter Diagnoses  Name Primary?  . HTN (hypertension), benign Yes  . Cigarette nicotine dependence without complication     -Reviewed labs and mammogram with pt -Continue lisinopril -Counseled on regular exercise -F/u 3 months.   RTO sooner prn

## 2015-12-16 ENCOUNTER — Other Ambulatory Visit: Payer: Self-pay | Admitting: Physician Assistant

## 2016-01-07 ENCOUNTER — Ambulatory Visit: Payer: Self-pay | Admitting: Physician Assistant

## 2016-01-07 ENCOUNTER — Encounter: Payer: Self-pay | Admitting: Physician Assistant

## 2016-01-07 VITALS — BP 110/86 | HR 82 | Temp 97.9°F | Ht 64.25 in | Wt 188.2 lb

## 2016-01-07 DIAGNOSIS — J069 Acute upper respiratory infection, unspecified: Secondary | ICD-10-CM

## 2016-01-07 DIAGNOSIS — I10 Essential (primary) hypertension: Secondary | ICD-10-CM

## 2016-01-07 DIAGNOSIS — F17210 Nicotine dependence, cigarettes, uncomplicated: Secondary | ICD-10-CM

## 2016-01-07 MED ORDER — BENZONATATE 100 MG PO CAPS: ORAL_CAPSULE | ORAL | 3 refills | Status: DC

## 2016-01-07 MED ORDER — AMOXICILLIN 500 MG PO CAPS: 500.0000 mg | ORAL_CAPSULE | Freq: Three times a day (TID) | ORAL | 0 refills | Status: AC

## 2016-01-07 NOTE — Progress Notes (Signed)
BP 110/86 (BP Location: Left Arm, Patient Position: Sitting, Cuff Size: Normal)   Pulse 82   Temp 97.9 F (36.6 C)   Ht 5' 4.25" (1.632 m)   Wt 188 lb 4 oz (85.4 kg)   SpO2 98%   BMI 32.06 kg/m    Subjective:    Patient ID: Claire LombardBillie J Welter, female    DOB: 07/21/1965, 50 y.o.   MRN: [id number]  HPI: Claire [name] is a 50 y.o. female presenting on 01/07/2016 for Sore Throat (pt c/o cough with sore throat for about 3 weeks. pt has been taking otc cough medicine and sucks on cough drops.)   HPI   Chief Complaint  Patient presents with  . Sore Throat    pt c/o cough with sore throat for about 3 weeks. pt has been taking otc cough medicine and sucks on cough drops.    Dry non productive cough.  No wheezing.  No EA.  ST is constant.  staes voice sometimes hoarse.  No fever.  She has felt some draingage down back of throat.  Used tussin CF that helps somewhat but doesn't last.  Also used some childrens cough syrup that doesn't help much  Pt is a smoker  She goes to [facility] for MH issues  Relevant past medical, surgical, family and social history reviewed and updated as indicated. Interim medical history since our last visit reviewed. Allergies and medications reviewed and updated.   Current Outpatient Prescriptions:  .  citalopram (CELEXA) 20 MG tablet, Take 20 mg by mouth daily., Disp: , Rfl:  .  lisinopril (PRINIVIL,ZESTRIL) 10 MG tablet, TAKE ONE TABLET BY MOUTH ONCE DAILY, Disp: 30 tablet, Rfl: 3 .  traZODone (DESYREL) 100 MG tablet, Take 100 mg by mouth at bedtime., Disp: , Rfl:    Review of Systems  Constitutional: Positive for fatigue. Negative for appetite change, chills, diaphoresis, fever and unexpected weight change.  HENT: Positive for dental problem, sore throat, trouble swallowing and voice change. Negative for congestion, drooling, ear pain, facial swelling, hearing loss, mouth sores and sneezing.   Eyes: Negative for pain, discharge, redness, itching and visual  disturbance.  Respiratory: Positive for cough and shortness of breath. Negative for choking and wheezing.   Cardiovascular: Negative for chest pain, palpitations and leg swelling.  Gastrointestinal: Negative for abdominal pain, blood in stool, constipation, diarrhea and vomiting.  Endocrine: Negative for cold intolerance, heat intolerance and polydipsia.  Genitourinary: Negative for decreased urine volume, dysuria and hematuria.  Musculoskeletal: Negative for arthralgias, back pain and gait problem.  Skin: Negative for rash.  Allergic/Immunologic: Negative for environmental allergies.  Neurological: Positive for headaches. Negative for seizures, syncope and light-headedness.  Hematological: Positive for adenopathy.  Psychiatric/Behavioral: Negative for agitation, dysphoric mood and suicidal ideas. The patient is nervous/anxious.     Per HPI unless specifically indicated above     Objective:    BP 110/86 (BP Location: Left Arm, Patient Position: Sitting, Cuff Size: Normal)   Pulse 82   Temp 97.9 F (36.6 C)   Ht 5' 4.25" (1.632 m)   Wt 188 lb 4 oz (85.4 kg)   SpO2 98%   BMI 32.06 kg/m   Wt Readings from Last 3 Encounters:  01/07/16 188 lb 4 oz (85.4 kg)  11/21/15 177 lb 9.6 oz (80.6 kg)  10/23/15 172 lb (78 kg)    Physical Exam  Constitutional: She is oriented to person, place, and time. She appears well-developed and well-nourished.  HENT:  Head: Normocephalic and  atraumatic.  Right Ear: Hearing, tympanic membrane, external ear and ear canal normal.  Left Ear: Hearing, tympanic membrane, external ear and ear canal normal.  Nose: Nose normal.  Mouth/Throat: Uvula is midline and oropharynx is clear and moist. No oropharyngeal exudate.  Neck: Neck supple.  Cardiovascular: Normal rate and regular rhythm.   Pulmonary/Chest: Effort normal and breath sounds normal. She has no wheezes.  Abdominal: Soft. Bowel sounds are normal. She exhibits no mass. There is no hepatosplenomegaly.  There is no tenderness.  Musculoskeletal: She exhibits no edema.  Lymphadenopathy:    She has no cervical adenopathy.  Neurological: She is alert and oriented to person, place, and time.  Skin: Skin is warm and dry.  Psychiatric: She has a normal mood and affect. Her behavior is normal.  Vitals reviewed.       Assessment & Plan:   Encounter Diagnoses  Name Primary?  . Acute upper respiratory infection Yes  . HTN (hypertension), benign   . Cigarette nicotine dependence without complication      -counseled on URI. rx amoxil and tessalon -counseled smoking cessation -bp doing good.   Continue lisinopril -follow up 3 months.   RTO sooner prn

## 2016-01-07 NOTE — Patient Instructions (Signed)
Upper Respiratory Infection, Adult Most upper respiratory infections (URIs) are a viral infection of the air passages leading to the lungs. A URI affects the nose, throat, and upper air passages. The most common type of URI is nasopharyngitis and is typically referred to as "the common cold." URIs run their course and usually go away on their own. Most of the time, a URI does not require medical attention, but sometimes a bacterial infection in the upper airways can follow a viral infection. This is called a secondary infection. Sinus and middle ear infections are common types of secondary upper respiratory infections. Bacterial pneumonia can also complicate a URI. A URI can worsen asthma and chronic obstructive pulmonary disease (COPD). Sometimes, these complications can require emergency medical care and may be life threatening. What are the causes? Almost all URIs are caused by viruses. A virus is a type of germ and can spread from one person to another. What increases the risk? You may be at risk for a URI if:  You smoke.  You have chronic heart or lung disease.  You have a weakened defense (immune) system.  You are very young or very old.  You have nasal allergies or asthma.  You work in crowded or poorly ventilated areas.  You work in health care facilities or schools.  What are the signs or symptoms? Symptoms typically develop 2-3 days after you come in contact with a cold virus. Most viral URIs last 7-10 days. However, viral URIs from the influenza virus (flu virus) can last 14-18 days and are typically more severe. Symptoms may include:  Runny or stuffy (congested) nose.  Sneezing.  Cough.  Sore throat.  Headache.  Fatigue.  Fever.  Loss of appetite.  Pain in your forehead, behind your eyes, and over your cheekbones (sinus pain).  Muscle aches.  How is this diagnosed? Your health care provider may diagnose a URI by:  Physical exam.  Tests to check that your  symptoms are not due to another condition such as: ? Strep throat. ? Sinusitis. ? Pneumonia. ? Asthma.  How is this treated? A URI goes away on its own with time. It cannot be cured with medicines, but medicines may be prescribed or recommended to relieve symptoms. Medicines may help:  Reduce your fever.  Reduce your cough.  Relieve nasal congestion.  Follow these instructions at home:  Take medicines only as directed by your health care provider.  Gargle warm saltwater or take cough drops to comfort your throat as directed by your health care provider.  Use a warm mist humidifier or inhale steam from a shower to increase air moisture. This may make it easier to breathe.  Drink enough fluid to keep your urine clear or pale yellow.  Eat soups and other clear broths and maintain good nutrition.  Rest as needed.  Return to work when your temperature has returned to normal or as your health care provider advises. You may need to stay home longer to avoid infecting others. You can also use a face mask and careful hand washing to prevent spread of the virus.  Increase the usage of your inhaler if you have asthma.  Do not use any tobacco products, including cigarettes, chewing tobacco, or electronic cigarettes. If you need help quitting, ask your health care provider. How is this prevented? The best way to protect yourself from getting a cold is to practice good hygiene.  Avoid oral or hand contact with people with cold symptoms.  Wash your   hands often if contact occurs.  There is no clear evidence that vitamin C, vitamin E, echinacea, or exercise reduces the chance of developing a cold. However, it is always recommended to get plenty of rest, exercise, and practice good nutrition. Contact a health care provider if:  You are getting worse rather than better.  Your symptoms are not controlled by medicine.  You have chills.  You have worsening shortness of breath.  You have  brown or red mucus.  You have yellow or brown nasal discharge.  You have pain in your face, especially when you bend forward.  You have a fever.  You have swollen neck glands.  You have pain while swallowing.  You have white areas in the back of your throat. Get help right away if:  You have severe or persistent: ? Headache. ? Ear pain. ? Sinus pain. ? Chest pain.  You have chronic lung disease and any of the following: ? Wheezing. ? Prolonged cough. ? Coughing up blood. ? A change in your usual mucus.  You have a stiff neck.  You have changes in your: ? Vision. ? Hearing. ? Thinking. ? Mood. This information is not intended to replace advice given to you by your health care provider. Make sure you discuss any questions you have with your health care provider. Document Released: 08/05/2000 Document Revised: 10/13/2015 Document Reviewed: 05/17/2013 Elsevier Interactive Patient Education  2017 [employer]  

## 2016-02-19 ENCOUNTER — Ambulatory Visit: Payer: Self-pay | Admitting: Physician Assistant

## 2016-03-13 ENCOUNTER — Emergency Department (HOSPITAL_COMMUNITY)
Admission: EM | Admit: 2016-03-13 | Discharge: 2016-03-13 | Disposition: A | Discharge status: Home / Self Care | Payer: Self-pay | Attending: Emergency Medicine | Admitting: Emergency Medicine

## 2016-03-13 ENCOUNTER — Emergency Department (HOSPITAL_COMMUNITY): Payer: Self-pay

## 2016-03-13 ENCOUNTER — Encounter (HOSPITAL_COMMUNITY): Payer: Self-pay | Admitting: Emergency Medicine

## 2016-03-13 DIAGNOSIS — I10 Essential (primary) hypertension: Secondary | ICD-10-CM | POA: Insufficient documentation

## 2016-03-13 DIAGNOSIS — F17210 Nicotine dependence, cigarettes, uncomplicated: Secondary | ICD-10-CM | POA: Insufficient documentation

## 2016-03-13 DIAGNOSIS — R109 Unspecified abdominal pain: Secondary | ICD-10-CM

## 2016-03-13 DIAGNOSIS — R079 Chest pain, unspecified: Secondary | ICD-10-CM | POA: Insufficient documentation

## 2016-03-13 DIAGNOSIS — R091 Pleurisy: Secondary | ICD-10-CM | POA: Insufficient documentation

## 2016-03-13 DIAGNOSIS — R110 Nausea: Secondary | ICD-10-CM | POA: Insufficient documentation

## 2016-03-13 DIAGNOSIS — R1011 Right upper quadrant pain: Secondary | ICD-10-CM | POA: Insufficient documentation

## 2016-03-13 DIAGNOSIS — Z79899 Other long term (current) drug therapy: Secondary | ICD-10-CM | POA: Insufficient documentation

## 2016-03-13 LAB — COMPREHENSIVE METABOLIC PANEL
ALT: 14 U/L (ref 14–54)
AST: 18 U/L (ref 15–41)
Albumin: 4.2 g/dL (ref 3.5–5.0)
Alkaline Phosphatase: 74 U/L (ref 38–126)
Anion gap: 8 (ref 5–15)
BUN: 15 mg/dL (ref 6–20)
CO2: 27 mmol/L (ref 22–32)
Calcium: 9.1 mg/dL (ref 8.9–10.3)
Chloride: 103 mmol/L (ref 101–111)
Creatinine, Ser: 0.86 mg/dL (ref 0.44–1.00)
GFR calc Af Amer: >60 mL/min (ref >60)
GFR calc non Af Amer: >60 mL/min (ref >60)
Glucose, Bld: 91 mg/dL (ref 65–99)
Potassium: 3.8 mmol/L (ref 3.5–5.1)
Sodium: 138 mmol/L (ref 135–145)
Total Bilirubin: 0.5 mg/dL (ref 0.3–1.2)
Total Protein: 7.6 g/dL (ref 6.5–8.1)

## 2016-03-13 LAB — URINALYSIS, ROUTINE W REFLEX MICROSCOPIC
Bilirubin Urine: NEGATIVE
Glucose, UA: NEGATIVE mg/dL
Hgb urine dipstick: NEGATIVE
Ketones, ur: NEGATIVE mg/dL
Leukocytes, UA: NEGATIVE
Nitrite: NEGATIVE
Protein, ur: NEGATIVE mg/dL
Specific Gravity, Urine: 1.01 (ref 1.005–1.030)
pH: 6 (ref 5.0–8.0)

## 2016-03-13 LAB — CBC WITH DIFFERENTIAL/PLATELET
Basophils Absolute: 0 10*3/uL (ref 0.0–0.1)
Basophils Relative: 1 %
Eosinophils Absolute: 0.1 10*3/uL (ref 0.0–0.7)
Eosinophils Relative: 1 %
HCT: 37.3 % (ref 36.0–46.0)
Hemoglobin: 13 g/dL (ref 12.0–15.0)
Lymphocytes Relative: 31 %
Lymphs Abs: 2.4 10*3/uL (ref 0.7–4.0)
MCH: 33 pg (ref 26.0–34.0)
MCHC: 34.9 g/dL (ref 30.0–36.0)
MCV: 94.7 fL (ref 78.0–100.0)
Monocytes Absolute: 0.4 10*3/uL (ref 0.1–1.0)
Monocytes Relative: 6 %
Neutro Abs: 4.6 10*3/uL (ref 1.7–7.7)
Neutrophils Relative %: 61 %
Platelets: 171 10*3/uL (ref 150–400)
RBC: 3.94 MIL/uL (ref 3.87–5.11)
RDW: 12.3 % (ref 11.5–15.5)
WBC: 7.6 10*3/uL (ref 4.0–10.5)

## 2016-03-13 LAB — HCG, SERUM, QUALITATIVE: Preg, Serum: NEGATIVE

## 2016-03-13 LAB — I-STAT BETA HCG BLOOD, ED (MC, WL, AP ONLY): I-stat hCG, quantitative: <5 m[IU]/mL (ref <5)

## 2016-03-13 LAB — D-DIMER, QUANTITATIVE: D-Dimer, Quant: 0.51 ug/mL-FEU — ABNORMAL HIGH (ref 0.00–0.50)

## 2016-03-13 LAB — LIPASE, BLOOD: Lipase: 34 U/L (ref 11–51)

## 2016-03-13 MED ORDER — IOPAMIDOL (ISOVUE-370) INJECTION 76%
100.0000 mL | Freq: Once | INTRAVENOUS | Status: AC | PRN
  Administered 2016-03-13: 100 mL via INTRAVENOUS

## 2016-03-13 MED ORDER — SODIUM CHLORIDE 0.9 % IV BOLUS (SEPSIS)
1000.0000 mL | Freq: Once | INTRAVENOUS | Status: AC
  Administered 2016-03-13: 1000 mL via INTRAVENOUS

## 2016-03-13 MED ORDER — ONDANSETRON HCL 4 MG/2ML IJ SOLN
4.0000 mg | Freq: Once | INTRAMUSCULAR | Status: AC
  Administered 2016-03-13: 4 mg via INTRAVENOUS
  Filled 2016-03-13: qty 2

## 2016-03-13 MED ORDER — SODIUM CHLORIDE 0.9 % IV SOLN: INTRAVENOUS | Status: DC

## 2016-03-13 MED ORDER — NAPROXEN 500 MG PO TABS: 500.0000 mg | ORAL_TABLET | Freq: Two times a day (BID) | ORAL | 0 refills | Status: DC

## 2016-03-13 MED ORDER — HYDROMORPHONE HCL 1 MG/ML IJ SOLN
0.5000 mg | INTRAMUSCULAR | Status: AC | PRN
  Administered 2016-03-13 (×3): 0.5 mg via INTRAVENOUS
  Filled 2016-03-13 (×3): qty 1

## 2016-03-13 MED ORDER — ETODOLAC 300 MG PO CAPS: 300.0000 mg | ORAL_CAPSULE | Freq: Three times a day (TID) | ORAL | 0 refills | Status: DC

## 2016-03-13 NOTE — ED Notes (Signed)
Patient transported to CT 

## 2016-03-13 NOTE — ED Triage Notes (Signed)
Patient complains of right side flank pian. States nausea and vomiting.

## 2016-03-13 NOTE — ED Notes (Signed)
ED Provider at bedside. 

## 2016-03-13 NOTE — ED Provider Notes (Signed)
[facility]-EMERGENCY DEPT Provider Note   CSN: [id number] Arrival date & time: 03/13/16  1118     History   Chief Complaint Chief Complaint  Patient presents with  . Flank Pain    HPI Claire [name] is a 51 y.o. female.  HPI Patient presents to mention with complaints of right flank pain that started a couple days ago. The symptoms gradually progressed until last night when it became more severe. This morning the pain was even more severe. Patient states the pain is sharp in the right flank area underneath her ribs. Pain increases with breathing. Does not seem to be affected by eating or drinking although she did have a couple episodes of nausea and vomiting. She denies any dysuria. She denies any leg swelling. She denies any shortness of breath. She does have an occasional cough. No history of PE or DVT. She has had her gallbladder removed.  Past Medical History:  Diagnosis Date  . Cirrhosis of liver (HCC)    stage 4  . Hepatitis C   . Hypertension 2012    Patient Active Problem List   Diagnosis Date Noted  . Substance abuse in remission 10/23/2015  . Back pain 12/13/2014  . Liver fibrosis (HCC) 12/13/2014  . Chronic hepatitis C without hepatic coma (HCC) 10/31/2014  . Generalized anxiety disorder 10/31/2014  . HTN (hypertension), benign 10/31/2014  . Diffuse arthralgia 10/31/2014  . Tobacco abuse 10/31/2014  . Previous back surgery 10/31/2014    Past Surgical History:  Procedure Laterality Date  . ABDOMINAL HYSTERECTOMY  2010  . ANKLE FRACTURE SURGERY    . arm surgery Right 05/2002  . BACK SURGERY  1997  . CESAREAN SECTION  1991  . CHOLECYSTECTOMY  1992  . HEEL SPUR SURGERY Right 2004    OB History    Gravida Para Term Preterm AB Living   3 3 3     3    SAB TAB Ectopic Multiple Live Births                   Home Medications    Prior to Admission medications   Medication Sig Start Date End Date Taking? Authorizing Provider  citalopram (CELEXA) 20 MG tablet  Take 20 mg by mouth daily.   Yes Historical Provider, MD  lisinopril (PRINIVIL,ZESTRIL) 10 MG tablet TAKE ONE TABLET BY MOUTH ONCE DAILY 12/16/15  Yes Jacquelin Hawking, PA-C  traZODone (DESYREL) 100 MG tablet Take 100 mg by mouth at bedtime.   Yes Historical Provider, MD  etodolac (LODINE) 300 MG capsule Take 1 capsule (300 mg total) by mouth every 8 (eight) hours. 03/13/16   Linwood Dibbles, MD    Family History Family History  Problem Relation Age of Onset  . Hypertension Mother   . Diabetes Mother   . Hyperlipidemia Mother   . Heart disease Father   . Hypertension Father   . Diabetes Father   . Hyperlipidemia Father   . Cancer Son     Brain Cancer 2006  . Hypertension Sister   . Diabetes Sister   . Diabetes Brother   . Hypertension Brother     Social History Social History  Substance Use Topics  . Smoking status: Current Every Day Smoker    Packs/day: 0.25    Years: 30.00    Types: Cigarettes  . Smokeless tobacco: Never Used  . Alcohol use No     Allergies   Toradol [ketorolac tromethamine]   Review of Systems Review of Systems  Constitutional:  Negative for fever.  Cardiovascular: Negative for chest pain.  Gastrointestinal: Positive for abdominal pain and nausea.  Genitourinary: Negative for dysuria.  Skin: Negative for color change and rash.  Neurological: Negative for speech difficulty.  All other systems reviewed and are negative.    Physical Exam Updated Vital Signs BP 91/55   Pulse (!) 58   Temp 97.9 F (36.6 C) (Oral)   Resp 16   Ht 5\' 5"  (1.651 m)   Wt 85.3 kg   SpO2 93%   BMI 31.28 kg/m   Physical Exam  Constitutional: She appears well-developed and well-nourished. No distress.  HENT:  Head: Normocephalic and atraumatic.  Right Ear: External ear normal.  Left Ear: External ear normal.  Eyes: Conjunctivae are normal. Right eye exhibits no discharge. Left eye exhibits no discharge. No scleral icterus.  Neck: Neck supple. No tracheal deviation  present.  Cardiovascular: Normal rate, regular rhythm and intact distal pulses.   Pulmonary/Chest: Effort normal and breath sounds normal. No stridor. No respiratory distress. She has no wheezes. She has no rales.  Abdominal: Soft. Bowel sounds are normal. She exhibits no distension. There is no hepatosplenomegaly. There is tenderness in the right upper quadrant. There is no rebound and no guarding. No hernia.  Musculoskeletal: She exhibits no edema or tenderness.  Neurological: She is alert. She has normal strength. No cranial nerve deficit (no facial droop, extraocular movements intact, no slurred speech) or sensory deficit. She exhibits normal muscle tone. She displays no seizure activity. Coordination normal.  Skin: Skin is warm and dry. No rash noted.  Psychiatric: She has a normal mood and affect.  Nursing note and vitals reviewed.    ED Treatments / Results  Labs (all labs ordered are listed, but only abnormal results are displayed) Labs Reviewed  D-DIMER, QUANTITATIVE (NOT AT [facility]) - Abnormal; Notable for the following:       Result Value   D-Dimer, Quant 0.51 (*)    All other components within normal limits  URINALYSIS, ROUTINE W REFLEX MICROSCOPIC  COMPREHENSIVE METABOLIC PANEL  LIPASE, BLOOD  CBC WITH DIFFERENTIAL/PLATELET  HCG, SERUM, QUALITATIVE  I-STAT BETA HCG BLOOD, ED ([facility], [facility], [facility] ONLY)    Radiology Dg Chest 2 View  Result Date: 03/13/2016 CLINICAL DATA:  Right flank pain, nausea and vomiting EXAM: CHEST  2 VIEW COMPARISON:  04/25/2010 FINDINGS: Cardiomediastinal silhouette is stable. No infiltrate or pleural effusion. No pulmonary edema. Mild degenerative changes mid thoracic spine. IMPRESSION: No active cardiopulmonary disease. Electronically Signed   By: Natasha MeadLiviu  Pop M.D.   On: 03/13/2016 16:30   Ct Angio Chest Pe W And/or Wo Contrast  Result Date: 03/13/2016 CLINICAL DATA:  Pleuritic right-sided chest pain. EXAM: CT ANGIOGRAPHY CHEST WITH CONTRAST TECHNIQUE:  Multidetector CT imaging of the chest was performed using the standard protocol during bolus administration of intravenous contrast. Multiplanar CT image reconstructions and MIPs were obtained to evaluate the vascular anatomy. CONTRAST:  100 cc Isovue 370 COMPARISON:  Chest x-ray from earlier same day. FINDINGS: Cardiovascular: There is no pulmonary embolism within the main, lobar or segmental pulmonary arteries bilaterally Thoracic aorta is normal in caliber. No aortic aneurysm or dissection. Heart size is normal. No pericardial effusion Mediastinum/Nodes: Scattered small lymph nodes within the mediastinum, none of which are pathologic by CT size criteria. Esophagus is unremarkable. Trachea and central bronchi appear normal. Lungs/Pleura: Bilateral upper lobe predominant emphysematous change and fibrosis, at least moderate in degree. Patchy dependent consolidations bilaterally are most likely atelectasis, possibly chronic. No pleural  effusion.  No pneumothorax. Upper Abdomen: Limited images of the upper abdomen are unremarkable. Patient is status post cholecystectomy. Musculoskeletal: Mild degenerative spurring within the thoracic spine. No acute or suspicious osseous finding. Review of the MIP images confirms the above findings. IMPRESSION: 1. No pulmonary embolism. 2. No aortic aneurysm or dissection. 3. Heart size is normal.  No pericardial effusion. 4. Bilateral upper lobe predominant emphysematous change and fibrosis, moderate to severe in degree for age. 5. Patchy consolidations within the dependent lower lobes bilaterally, most likely atelectasis, possibly chronic. Pneumonia is considered much less likely unless febrile. Lungs otherwise clear. Electronically Signed   By: Bary Richard M.D.   On: 03/13/2016 19:34    Procedures Procedures (including critical care time)  Medications Ordered in ED Medications  sodium chloride 0.9 % bolus 1,000 mL (0 mLs Intravenous Stopped 03/13/16 1844)    And  0.9 %   sodium chloride infusion (not administered)  HYDROmorphone (DILAUDID) injection 0.5 mg (0.5 mg Intravenous Given 03/13/16 1842)  ondansetron (ZOFRAN) injection 4 mg (4 mg Intravenous Given 03/13/16 1553)  iopamidol (ISOVUE-370) 76 % injection 100 mL (100 mLs Intravenous Contrast Given 03/13/16 1853)     Initial Impression / Assessment and Plan / ED Course  I have reviewed the triage vital signs and the nursing notes.  Pertinent labs & imaging results that were available during my care of the patient were reviewed by me and considered in my medical decision making (see chart for details).  Clinical Course as of Mar 13 2000  Fri Mar 13, 2016  1719 D dimer is just above normal range.  Pt does have pleuritic pain.  No other etiology for her sx.  Will ct chest  [JK]    Clinical Course User Index [JK] Linwood Dibbles, MD    Pt presented with complaints of right flank pain.   She is s/p cholecystectomy.  No pancreatitis or hepatitis indicated by labs.  Normal CBC and electrolye panel.  D dimer elevated with pleuritic pain so ct scan performed.  No PE.  Pt is still having flank pain.  At this point no indication of any emergency condition.  Doubt PNA without fever, severe cough etc.  Will dc home with nsaids.  Suspect pleurisy  Final Clinical Impressions(s) / ED Diagnoses   Final diagnoses:  Flank pain  Pleurisy    New Prescriptions New Prescriptions   ETODOLAC (LODINE) 300 MG CAPSULE    Take 1 capsule (300 mg total) by mouth every 8 (eight) hours.     Linwood Dibbles, MD 03/13/16 2002

## 2016-03-13 NOTE — Discharge Instructions (Signed)
Follow-up with your primary care doctor next week to be rechecked, take anti-inflammatory pain medications as prescribed

## 2016-04-07 ENCOUNTER — Ambulatory Visit: Payer: Self-pay | Admitting: Physician Assistant

## 2016-04-07 ENCOUNTER — Encounter: Payer: Self-pay | Admitting: Physician Assistant

## 2016-04-07 VITALS — BP 120/80 | HR 93 | Temp 97.7°F | Ht 64.25 in | Wt 186.0 lb

## 2016-04-07 DIAGNOSIS — F17210 Nicotine dependence, cigarettes, uncomplicated: Secondary | ICD-10-CM

## 2016-04-07 DIAGNOSIS — I10 Essential (primary) hypertension: Secondary | ICD-10-CM

## 2016-04-07 DIAGNOSIS — F1910 Other psychoactive substance abuse, uncomplicated: Secondary | ICD-10-CM

## 2016-04-07 NOTE — Progress Notes (Signed)
BP 120/80 (BP Location: Left Arm, Patient Position: Sitting, Cuff Size: Normal)   Pulse 93   Temp 97.7 F (36.5 C)   Ht 5' 4.25" (1.632 m)   Wt 186 lb (84.4 kg)   SpO2 98%   BMI 31.68 kg/m    Subjective:    Patient ID: Claire [name], female    DOB: 04/02/1965, 51 y.o.   MRN: [id number]  HPI: Claire LombardBillie J [name] is a 51 y.o. female presenting on 04/07/2016 for Hypertension   HPI   [facility] at [facility].  She relapsed but hopes to get back in tomorrow.   She is still going to [facility].   She is going later this afternoon  Relevant past medical, surgical, family and social history reviewed and updated as indicated. Interim medical history since our last visit reviewed. Allergies and medications reviewed and updated.  CURRENTMEDS: Citalopram Lisinopril trazodone  Review of Systems  Constitutional: Negative for appetite change, chills, diaphoresis, fatigue, fever and unexpected weight change.  HENT: Positive for congestion, dental problem, sneezing and sore throat. Negative for drooling, ear pain, facial swelling, hearing loss, mouth sores, trouble swallowing and voice change.   Eyes: Negative for pain, discharge, redness, itching and visual disturbance.  Respiratory: Positive for cough. Negative for choking, shortness of breath and wheezing.   Cardiovascular: Negative for chest pain, palpitations and leg swelling.  Gastrointestinal: Negative for abdominal pain, blood in stool, constipation, diarrhea and vomiting.  Endocrine: Negative for cold intolerance, heat intolerance and polydipsia.  Genitourinary: Negative for decreased urine volume, dysuria and hematuria.  Musculoskeletal: Negative for arthralgias, back pain and gait problem.  Skin: Negative for rash.  Allergic/Immunologic: Positive for environmental allergies.  Neurological: Positive for headaches. Negative for seizures, syncope and light-headedness.  Hematological: Negative for adenopathy.   Psychiatric/Behavioral: Negative for agitation, dysphoric mood and suicidal ideas. The patient is nervous/anxious.     Per HPI unless specifically indicated above     Objective:    BP 120/80 (BP Location: Left Arm, Patient Position: Sitting, Cuff Size: Normal)   Pulse 93   Temp 97.7 F (36.5 C)   Ht 5' 4.25" (1.632 m)   Wt 186 lb (84.4 kg)   SpO2 98%   BMI 31.68 kg/m   Wt Readings from Last 3 Encounters:  04/07/16 186 lb (84.4 kg)  03/13/16 188 lb (85.3 kg)  01/07/16 188 lb 4 oz (85.4 kg)    Physical Exam  Constitutional: She is oriented to person, place, and time. She appears well-developed and well-nourished.  HENT:  Head: Normocephalic and atraumatic.  Neck: Neck supple.  Cardiovascular: Normal rate and regular rhythm.   Pulmonary/Chest: Effort normal and breath sounds normal.  Abdominal: Soft. Bowel sounds are normal. She exhibits no mass. There is no hepatosplenomegaly. There is no tenderness.  Musculoskeletal: She exhibits no edema.  Lymphadenopathy:    She has no cervical adenopathy.  Neurological: She is alert and oriented to person, place, and time.  Skin: Skin is warm and dry.  Psychiatric: She has a normal mood and affect. Her behavior is normal.  Vitals reviewed.       Assessment & Plan:    Encounter Diagnoses  Name Primary?  . HTN (hypertension), benign Yes  . Cigarette nicotine dependence without complication   . Substance abuse     -pt to continue current medications -pt to continue with [facility] -[facility] and get assistance with substance issues -follow up 3 months.  RTO  sooner prn

## 2016-04-09 ENCOUNTER — Ambulatory Visit: Payer: Self-pay | Admitting: Physician Assistant

## 2016-04-09 ENCOUNTER — Encounter: Payer: Self-pay | Admitting: Physician Assistant

## 2016-04-09 VITALS — BP 124/78 | HR 92 | Temp 97.7°F | Ht 64.25 in | Wt 183.5 lb

## 2016-04-09 DIAGNOSIS — J309 Allergic rhinitis, unspecified: Secondary | ICD-10-CM

## 2016-04-09 DIAGNOSIS — J3081 Allergic rhinitis due to animal (cat) (dog) hair and dander: Secondary | ICD-10-CM

## 2016-04-09 MED ORDER — PREDNISONE 10 MG PO TABS: ORAL_TABLET | ORAL | 0 refills | Status: AC

## 2016-04-09 NOTE — Progress Notes (Signed)
BP 124/78 (BP Location: Left Arm, Patient Position: Sitting, Cuff Size: Normal)   Pulse 92   Temp 97.7 F (36.5 C)   Ht 5' 4.25" (1.632 m)   Wt 183 lb 8 oz (83.2 kg)   SpO2 97%   BMI 31.25 kg/m    Subjective:    Patient ID: Claire [name], female    DOB: 04-20-1965, 51 y.o.   MRN: [id number]  HPI: Claire [name] is a 51 y.o. female presenting on 04/09/2016 for Nasal Congestion (pt states she has been around cats lately and states she is unsure if she is allergic to them. pt states she has been sneezing, coughing, swollen on R side of neck, watery eyes, chest congestion, clear mucous, HA.  pt states she has ten OTC allergy medicine and states it helps a little bit.)   HPI    Chief Complaint  Patient presents with  . Nasal Congestion    pt states she has been around cats lately and states she is unsure if she is allergic to them. pt states she has been sneezing, coughing, swollen on R side of neck, watery eyes, chest congestion, clear mucous, HA.  pt states she has ten OTC allergy medicine and states it helps a little bit.   Pt returns to [facility] tomorrow.    She is staying around cats and dogs.  Started staying there couple days ago.  She took benedryl which helped the sneezing but she is still congestion and cough and scratching throat.  She is a smoker  Relevant past medical, surgical, family and social history reviewed and updated as indicated. Interim medical history since our last visit reviewed. Allergies and medications reviewed and updated.   Current Outpatient Prescriptions:  .  citalopram (CELEXA) 20 MG tablet, Take 20 mg by mouth daily., Disp: , Rfl:  .  lisinopril (PRINIVIL,ZESTRIL) 10 MG tablet, TAKE ONE TABLET BY MOUTH ONCE DAILY, Disp: 30 tablet, Rfl: 3 .  traZODone (DESYREL) 100 MG tablet, Take 100 mg by mouth at bedtime., Disp: , Rfl:  .  etodolac (LODINE) 300 MG capsule, Take 1 capsule (300 mg total) by mouth every 8 (eight) hours. (Patient not taking:  Reported on 04/07/2016), Disp: 21 capsule, Rfl: 0   Review of Systems  Constitutional: Positive for fatigue. Negative for appetite change, chills, diaphoresis, fever and unexpected weight change.  HENT: Positive for congestion, sneezing and sore throat. Negative for drooling, ear pain, facial swelling, hearing loss, mouth sores, trouble swallowing and voice change.   Eyes: Positive for discharge and itching. Negative for pain, redness and visual disturbance.  Respiratory: Positive for cough and shortness of breath. Negative for choking and wheezing.   Cardiovascular: Negative for chest pain, palpitations and leg swelling.  Gastrointestinal: Negative for abdominal pain, blood in stool, constipation, diarrhea and vomiting.  Endocrine: Negative for cold intolerance, heat intolerance and polydipsia.  Genitourinary: Negative for decreased urine volume, dysuria and hematuria.  Musculoskeletal: Negative for arthralgias, back pain and gait problem.  Skin: Negative for rash.  Allergic/Immunologic: Positive for environmental allergies.  Neurological: Positive for light-headedness and headaches. Negative for seizures and syncope.  Hematological: Negative for adenopathy.  Psychiatric/Behavioral: Positive for agitation. Negative for dysphoric mood and suicidal ideas. The patient is nervous/anxious.     Per HPI unless specifically indicated above     Objective:    BP 124/78 (BP Location: Left Arm, Patient Position: Sitting, Cuff Size: Normal)   Pulse 92   Temp 97.7 F (36.5 C)  Ht 5' 4.25" (1.632 m)   Wt 183 lb 8 oz (83.2 kg)   SpO2 97%   BMI 31.25 kg/m   Wt Readings from Last 3 Encounters:  04/09/16 183 lb 8 oz (83.2 kg)  04/07/16 186 lb (84.4 kg)  03/13/16 188 lb (85.3 kg)    Physical Exam  Constitutional: She is oriented to person, place, and time. She appears well-developed and well-nourished.  HENT:  Head: Normocephalic and atraumatic.  Right Ear: Hearing, tympanic membrane,  external ear and ear canal normal.  Left Ear: Hearing, tympanic membrane, external ear and ear canal normal.  Nose: Mucosal edema and rhinorrhea present.  Mouth/Throat: Uvula is midline and oropharynx is clear and moist. No oropharyngeal exudate.  Eyes: Right conjunctiva is injected (mild). Left conjunctiva is injected (mild).  B eyes watery, puffiness below both eyes  Neck: Neck supple.  Cardiovascular: Normal rate and regular rhythm.   Pulmonary/Chest: Effort normal and breath sounds normal. She has no wheezes.  Lymphadenopathy:    She has no cervical adenopathy.  Neurological: She is alert and oriented to person, place, and time.  Skin: Skin is warm and dry.  Psychiatric: She has a normal mood and affect. Her behavior is normal.  Vitals reviewed.       Assessment & Plan:   Encounter Diagnoses  Name Primary?  . Allergic rhinitis, unspecified chronicity, unspecified seasonality, unspecified trigger Yes  . Cat allergies      -Continue otc benedryl or claritan -Pt has tessalon rx waiting for her at pharmacy (refill on old rx) -discussed prednisone and she has done well on it in the past.  rx Short prednisone taper -pt counseled to avoid the pet house if at all possible and avoid smoking -follow up as scheduled.  RTO sooner prn

## 2016-04-22 ENCOUNTER — Telehealth: Payer: Self-pay | Admitting: Student

## 2016-04-22 ENCOUNTER — Other Ambulatory Visit: Payer: Self-pay | Admitting: Physician Assistant

## 2016-04-22 MED ORDER — BENZONATATE 100 MG PO CAPS: ORAL_CAPSULE | ORAL | 3 refills | Status: DC

## 2016-04-22 NOTE — Telephone Encounter (Signed)
Pt called c/o having a bad cough. Pt states she was given prednisone at her last OV and all her other symptoms were relieved, but is still having a bad cough. 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 Pt states cough spell last for about a minute. Pt states unable to sleep due to cough and she also wakes up coughing. Per PA's last OV note on 04-09-16 pt was advised to get tessalon (refill on old rx) from pharmacy. Per patient she went to the pharmacy and was told she had no refills, therefore, she did not have tessalon.   PA sent a new rx fro [facility] Pt notified and pt was thankful.

## 2016-04-23 ENCOUNTER — Encounter: Payer: Self-pay | Admitting: Physician Assistant

## 2016-04-23 ENCOUNTER — Ambulatory Visit: Payer: Self-pay | Admitting: Physician Assistant

## 2016-04-23 VITALS — BP 134/84 | HR 79 | Temp 97.9°F | Wt 191.0 lb

## 2016-04-23 DIAGNOSIS — J029 Acute pharyngitis, unspecified: Secondary | ICD-10-CM

## 2016-04-23 DIAGNOSIS — J069 Acute upper respiratory infection, unspecified: Secondary | ICD-10-CM

## 2016-04-23 LAB — POCT RAPID STREP A (OFFICE): Rapid Strep A Screen: NEGATIVE

## 2016-04-23 NOTE — Progress Notes (Signed)
BP 134/84 (BP Location: Left Arm, Patient Position: Sitting, Cuff Size: Large)   Pulse 79   Temp 97.9 F (36.6 C)   Wt 191 lb (86.6 kg)   SpO2 98%   BMI 32.53 kg/m    Subjective:    Patient ID: Claire [name], female    DOB: 12/18/1965, 51 y.o.   MPeterson LombardN: [id number]  HPI: Claire [name] is a 51 y.o. female presenting on 04/23/2016 for No chief complaint on file.   HPI   Pt here today for sore throat.  She was seen for will allergies on 2/15.     States still sick- cough, congestion,  sore throat.  [facility].  She continues to smoke.   She denies fever.  Pt took prednisone and says it didn't help  Relevant past medical, surgical, family and social history reviewed and updated as indicated. Interim medical history since our last visit reviewed. Allergies and medications reviewed and updated.   Current Outpatient Prescriptions:  .  benzonatate (TESSALON) 100 MG capsule, 1-2 po q 8 hour prn cough, Disp: 20 capsule, Rfl: 3 .  citalopram (CELEXA) 20 MG tablet, Take 20 mg by mouth daily., Disp: , Rfl:  .  lisinopril (PRINIVIL,ZESTRIL) 10 MG tablet, TAKE ONE TABLET BY MOUTH ONCE DAILY, Disp: 30 tablet, Rfl: 3 .  traZODone (DESYREL) 100 MG tablet, Take 100 mg by mouth at bedtime., Disp: , Rfl:  .  etodolac (LODINE) 300 MG capsule, Take 1 capsule (300 mg total) by mouth every 8 (eight) hours. (Patient not taking: Reported on 04/07/2016), Disp: 21 capsule, Rfl: 0   Review of Systems  Constitutional: Negative for appetite change, chills, diaphoresis, fatigue, fever and unexpected weight change.  HENT: Positive for congestion, dental problem, sneezing, sore throat, trouble swallowing and voice change. Negative for drooling, ear pain, facial swelling, hearing loss and mouth sores.   Eyes: Positive for discharge and redness. Negative for pain, itching and visual disturbance.  Respiratory: Positive for cough. Negative for choking, shortness of breath and wheezing.    Cardiovascular: Negative for chest pain, palpitations and leg swelling.  Gastrointestinal: Negative for abdominal pain, blood in stool, constipation, diarrhea and vomiting.  Endocrine: Negative for cold intolerance, heat intolerance and polydipsia.  Genitourinary: Negative for decreased urine volume, dysuria and hematuria.  Musculoskeletal: Negative for arthralgias, back pain and gait problem.  Skin: Negative for rash.  Allergic/Immunologic: Positive for environmental allergies.  Neurological: Positive for headaches. Negative for seizures, syncope and light-headedness.  Hematological: Negative for adenopathy.  Psychiatric/Behavioral: Negative for agitation, dysphoric mood and suicidal ideas. The patient is not nervous/anxious.     Per HPI unless specifically indicated above     Objective:    BP 134/84 (BP Location: Left Arm, Patient Position: Sitting, Cuff Size: Large)   Pulse 79   Temp 97.9 F (36.6 C)   Wt 191 lb (86.6 kg)   SpO2 98%   BMI 32.53 kg/m   Wt Readings from Last 3 Encounters:  04/23/16 191 lb (86.6 kg)  04/09/16 183 lb 8 oz (83.2 kg)  04/07/16 186 lb (84.4 kg)    Physical Exam  Constitutional: She is oriented to person, place, and time. She appears well-developed and well-nourished.  HENT:  Head: Normocephalic and atraumatic.  Right Ear: Hearing, tympanic membrane, external ear and ear canal normal.  Left Ear: Hearing, tympanic membrane, external ear and ear canal normal.  Nose: Rhinorrhea present.  Mouth/Throat: Uvula is midline and oropharynx is clear and  moist. No oropharyngeal exudate.  Neck: Neck supple.  Cardiovascular: Normal rate and regular rhythm.   Pulmonary/Chest: Effort normal and breath sounds normal. She has no wheezes.  Lymphadenopathy:    She has no cervical adenopathy.  Neurological: She is alert and oriented to person, place, and time.  Skin: Skin is warm and dry.  Psychiatric: She has a normal mood and affect. Her behavior is normal.   Vitals reviewed.   RST -    Assessment & Plan:   Encounter Diagnoses  Name Primary?  . Acute upper respiratory infection Yes  . Sore throat      -Reassured pt strep test negative.  Encouraged frequent warm salt water gargles, avoidance of smoking, rest, fluids, apap or ibu prn.   She has tessalon to use prn -follow up as scheduled.   RTO sooner prn

## 2016-04-23 NOTE — Patient Instructions (Signed)
Warm salt water gargles Avoid smoking    Upper Respiratory Infection, Adult Most upper respiratory infections (URIs) are a viral infection of the air passages leading to the lungs. A URI affects the nose, throat, and upper air passages. The most common type of URI is nasopharyngitis and is typically referred to as "the common cold." URIs run their course and usually go away on their own. Most of the time, a URI does not require medical attention, but sometimes a bacterial infection in the upper airways can follow a viral infection. This is called a secondary infection. Sinus and middle ear infections are common types of secondary upper respiratory infections. Bacterial pneumonia can also complicate a URI. A URI can worsen asthma and chronic obstructive pulmonary disease (COPD). Sometimes, these complications can require emergency medical care and may be life threatening. What are the causes? Almost all URIs are caused by viruses. A virus is a type of germ and can spread from one person to another. What increases the risk? You may be at risk for a URI if:  You smoke.  You have chronic heart or lung disease.  You have a weakened defense (immune) system.  You are very young or very old.  You have nasal allergies or asthma.  You work in crowded or poorly ventilated areas.  You work in health care facilities or schools. What are the signs or symptoms? Symptoms typically develop 2-3 days after you come in contact with a cold virus. Most viral URIs last 7-10 days. However, viral URIs from the influenza virus (flu virus) can last 14-18 days and are typically more severe. Symptoms may include:  Runny or stuffy (congested) nose.  Sneezing.  Cough.  Sore throat.  Headache.  Fatigue.  Fever.  Loss of appetite.  Pain in your forehead, behind your eyes, and over your cheekbones (sinus pain).  Muscle aches. How is this diagnosed? Your health care provider may diagnose a URI  by:  Physical exam.  Tests to check that your symptoms are not due to another condition such as:  Strep throat.  Sinusitis.  Pneumonia.  Asthma. How is this treated? A URI goes away on its own with time. It cannot be cured with medicines, but medicines may be prescribed or recommended to relieve symptoms. Medicines may help:  Reduce your fever.  Reduce your cough.  Relieve nasal congestion. Follow these instructions at home:  Take medicines only as directed by your health care provider.  Gargle warm saltwater or take cough drops to comfort your throat as directed by your health care provider.  Use a warm mist humidifier or inhale steam from a shower to increase air moisture. This may make it easier to breathe.  Drink enough fluid to keep your urine clear or pale yellow.  Eat soups and other clear broths and maintain good nutrition.  Rest as needed.  Return to work when your temperature has returned to normal or as your health care provider advises. You may need to stay home longer to avoid infecting others. You can also use a face mask and careful hand washing to prevent spread of the virus.  Increase the usage of your inhaler if you have asthma.  Do not use any tobacco products, including cigarettes, chewing tobacco, or electronic cigarettes. If you need help quitting, ask your health care provider. How is this prevented? The best way to protect yourself from getting a cold is to practice good hygiene.  Avoid oral or hand contact with people with  cold symptoms.  Wash your hands often if contact occurs. There is no clear evidence that vitamin C, vitamin E, echinacea, or exercise reduces the chance of developing a cold. However, it is always recommended to get plenty of rest, exercise, and practice good nutrition. Contact a health care provider if:  You are getting worse rather than better.  Your symptoms are not controlled by medicine.  You have chills.  You  have worsening shortness of breath.  You have brown or red mucus.  You have yellow or brown nasal discharge.  You have pain in your face, especially when you bend forward.  You have a fever.  You have swollen neck glands.  You have pain while swallowing.  You have white areas in the back of your throat. Get help right away if:  You have severe or persistent:  Headache.  Ear pain.  Sinus pain.  Chest pain.  You have chronic lung disease and any of the following:  Wheezing.  Prolonged cough.  Coughing up blood.  A change in your usual mucus.  You have a stiff neck.  You have changes in your:  Vision.  Hearing.  Thinking.  Mood. This information is not intended to replace advice given to you by your health care provider. Make sure you discuss any questions you have with your health care provider. Document Released: 08/05/2000 Document Revised: 10/13/2015 Document Reviewed: 05/17/2013 Elsevier Interactive Patient Education  2017 [employer].

## 2016-05-02 ENCOUNTER — Other Ambulatory Visit: Payer: Self-pay | Admitting: Physician Assistant

## 2016-06-23 ENCOUNTER — Ambulatory Visit: Payer: Self-pay | Admitting: Physician Assistant

## 2016-06-23 ENCOUNTER — Encounter: Payer: Self-pay | Admitting: Physician Assistant

## 2016-06-23 VITALS — BP 118/78 | HR 82 | Temp 97.9°F | Ht 64.25 in | Wt 195.0 lb

## 2016-06-23 DIAGNOSIS — I10 Essential (primary) hypertension: Secondary | ICD-10-CM

## 2016-06-23 DIAGNOSIS — M25561 Pain in right knee: Secondary | ICD-10-CM

## 2016-06-23 DIAGNOSIS — F1911 Other psychoactive substance abuse, in remission: Secondary | ICD-10-CM

## 2016-06-23 DIAGNOSIS — F17210 Nicotine dependence, cigarettes, uncomplicated: Secondary | ICD-10-CM

## 2016-06-23 MED ORDER — DICLOFENAC SODIUM 75 MG PO TBEC: 75.0000 mg | DELAYED_RELEASE_TABLET | Freq: Two times a day (BID) | ORAL | 0 refills | Status: DC | PRN

## 2016-06-23 NOTE — Progress Notes (Signed)
BP 118/78 (BP Location: Left Arm, Patient Position: Sitting, Cuff Size: Normal)   Pulse 82   Temp 97.9 F (36.6 C) (Other (Comment))   Ht 5' 4.25" (1.632 m)   Wt 195 lb (88.5 kg)   SpO2 97%   BMI 33.21 kg/m    Subjective:    Patient ID: Claire [name], female    DOB: 05-14-1965, 51 y.o.   MRN: [id number]  HPI: Claire [name] is a 51 y.o. female presenting on 06/23/2016 for Knee Pain (injured right knee about 2 days ago, pain is getting worse. States hit knee on recliner frame)   HPI   Chief Complaint  Patient presents with  . Knee Pain    injured right knee about 2 days ago, pain is getting worse. States hit knee on recliner frame   Pt bumped her knee into a chair on Sunday.  She said it hurt at the time but that on Monday she was doing well and not having pain until evening approached and then she started having discomfort in the knee.    Relevant past medical, surgical, family and social history reviewed and updated as indicated. Interim medical history since our last visit reviewed. Allergies and medications reviewed and updated.   Current Outpatient Prescriptions:  .  citalopram (CELEXA) 20 MG tablet, Take 20 mg by mouth daily., Disp: , Rfl:  .  lisinopril (PRINIVIL,ZESTRIL) 10 MG tablet, TAKE ONE TABLET BY MOUTH ONCE DAILY, Disp: 30 tablet, Rfl: 3 .  traZODone (DESYREL) 100 MG tablet, Take 100 mg by mouth at bedtime., Disp: , Rfl:    Review of Systems  Constitutional: Negative for appetite change, chills, diaphoresis, fatigue, fever and unexpected weight change.  HENT: Positive for dental problem. Negative for congestion, drooling, ear pain, facial swelling, hearing loss, mouth sores, sneezing, sore throat, trouble swallowing and voice change.   Eyes: Negative for pain, discharge, redness, itching and visual disturbance.  Respiratory: Positive for cough. Negative for choking, shortness of breath and wheezing.   Cardiovascular: Negative for chest pain, palpitations and  leg swelling.  Gastrointestinal: Negative for abdominal pain, blood in stool, constipation, diarrhea and vomiting.  Endocrine: Negative for cold intolerance, heat intolerance and polydipsia.  Genitourinary: Negative for decreased urine volume, dysuria and hematuria.  Musculoskeletal: Positive for gait problem. Negative for arthralgias and back pain.  Skin: Negative for rash.  Allergic/Immunologic: Positive for environmental allergies.  Neurological: Negative for seizures, syncope, light-headedness and headaches.  Hematological: Negative for adenopathy.  Psychiatric/Behavioral: Negative for agitation, dysphoric mood and suicidal ideas. The patient is not nervous/anxious.     Per HPI unless specifically indicated above     Objective:    BP 118/78 (BP Location: Left Arm, Patient Position: Sitting, Cuff Size: Normal)   Pulse 82   Temp 97.9 F (36.6 C) (Other (Comment))   Ht 5' 4.25" (1.632 m)   Wt 195 lb (88.5 kg)   SpO2 97%   BMI 33.21 kg/m   Wt Readings from Last 3 Encounters:  06/23/16 195 lb (88.5 kg)  04/23/16 191 lb (86.6 kg)  04/09/16 183 lb 8 oz (83.2 kg)    Physical Exam  Constitutional: She is oriented to person, place, and time. She appears well-developed and well-nourished.  HENT:  Head: Normocephalic and atraumatic.  Neck: Neck supple.  Cardiovascular: Normal rate and regular rhythm.   Pulmonary/Chest: Effort normal and breath sounds normal.  Abdominal: Soft. Bowel sounds are normal. She exhibits no mass. There is no hepatosplenomegaly. There is no  tenderness.  Musculoskeletal: She exhibits no edema.       Right knee: She exhibits normal range of motion, no swelling, no effusion, no ecchymosis, no deformity, no laceration, no erythema, normal alignment, no LCL laxity, normal patellar mobility and no MCL laxity.  Lymphadenopathy:    She has no cervical adenopathy.  Neurological: She is alert and oriented to person, place, and time.  Skin: Skin is warm and dry.   Psychiatric: She has a normal mood and affect. Her behavior is normal.  Nursing note and vitals reviewed.       Assessment & Plan:   Encounter Diagnoses  Name Primary?  . Acute pain of right knee Yes  . HTN (hypertension), benign   . Substance abuse in remission   . Cigarette nicotine dependence without complication    -pt to Ice the knee 3-4 times daily for 10-20 minutes.   Use Ace wrap on the knee.  gave pt rx for Diclofenac as needed.  She is counseled to Avoid working on her knees and Avoid long walks for 3-4 days -will cancel appt for htn later this month.  Her bp is great and she is to continue the lisinopril -follow up in 3 months.  RTO sooner prn

## 2016-06-23 NOTE — Patient Instructions (Addendum)
Ice the knee 3-4 times daily for 10-20 minutes.  Ace wrap on the knee.   Diclofenac as needed Avoid working on your knees. Avoid long walks for 3-4 days

## 2016-07-06 ENCOUNTER — Ambulatory Visit: Payer: Self-pay | Admitting: Physician Assistant

## 2016-09-23 ENCOUNTER — Ambulatory Visit: Payer: Self-pay | Admitting: Physician Assistant

## 2016-09-23 ENCOUNTER — Other Ambulatory Visit: Payer: Self-pay | Admitting: Physician Assistant

## 2016-09-23 ENCOUNTER — Encounter: Payer: Self-pay | Admitting: Physician Assistant

## 2016-09-23 VITALS — BP 116/80 | HR 78 | Temp 97.3°F | Ht 64.25 in | Wt 183.2 lb

## 2016-09-23 DIAGNOSIS — Z1211 Encounter for screening for malignant neoplasm of colon: Secondary | ICD-10-CM

## 2016-09-23 DIAGNOSIS — F1911 Other psychoactive substance abuse, in remission: Secondary | ICD-10-CM

## 2016-09-23 DIAGNOSIS — F17210 Nicotine dependence, cigarettes, uncomplicated: Secondary | ICD-10-CM

## 2016-09-23 DIAGNOSIS — I10 Essential (primary) hypertension: Secondary | ICD-10-CM

## 2016-09-23 NOTE — Progress Notes (Signed)
BP 116/80 (BP Location: Left Arm, Patient Position: Sitting, Cuff Size: Normal)   Pulse 78   Temp (!) 97.3 F (36.3 C)   Ht 5' 4.25" (1.632 m)   Wt 183 lb 4 oz (83.1 kg)   SpO2 99%   BMI 31.21 kg/m    Subjective:    Patient ID: Claire [name], female    DOB: 07/26/1965, 51 y.o.   MRN: [id number]  HPI: Claire [name] is a 51 y.o. female presenting on 09/23/2016 for Hypertension   HPI   Pt is not at [facility] but she is doing well.  She states she is just past 6 months clean.  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    [facility]  Relevant past medical, surgical, family and social history reviewed and updated as indicated. Interim medical history since our last visit reviewed. Allergies and medications reviewed and updated.   Current Outpatient Prescriptions:  .  citalopram (CELEXA) 20 MG tablet, Take 20 mg by mouth daily., Disp: , Rfl:  .  lisinopril (PRINIVIL,ZESTRIL) 10 MG tablet, TAKE ONE TABLET BY MOUTH ONCE DAILY, Disp: 30 tablet, Rfl: 3 .  traZODone (DESYREL) 100 MG tablet, Take 100 mg by mouth at bedtime., Disp: , Rfl:    Review of Systems  Constitutional: Negative for appetite change, chills, diaphoresis, fatigue, fever and unexpected weight change.  HENT: Positive for dental problem. Negative for congestion, drooling, ear pain, facial swelling, hearing loss, mouth sores, sneezing, sore throat, trouble swallowing and voice change.   Eyes: Negative for pain, discharge, redness, itching and visual disturbance.  Respiratory: Negative for cough, choking, shortness of breath and wheezing.   Cardiovascular: Negative for chest pain, palpitations and leg swelling.  Gastrointestinal: Negative for abdominal pain, blood in stool, constipation, diarrhea and vomiting.  Endocrine: Negative for cold intolerance, heat intolerance and polydipsia.  Genitourinary: Negative for decreased urine volume, dysuria and hematuria.  Musculoskeletal: Negative for  arthralgias, back pain and gait problem.  Skin: Negative for rash.  Allergic/Immunologic: Negative for environmental allergies.  Neurological: Negative for seizures, syncope, light-headedness and headaches.  Hematological: Negative for adenopathy.  Psychiatric/Behavioral: Negative for agitation, dysphoric mood and suicidal ideas. The patient is nervous/anxious.     Per HPI unless specifically indicated above     Objective:    BP 116/80 (BP Location: Left Arm, Patient Position: Sitting, Cuff Size: Normal)   Pulse 78   Temp (!) 97.3 F (36.3 C)   Ht 5' 4.25" (1.632 m)   Wt 183 lb 4 oz (83.1 kg)   SpO2 99%   BMI 31.21 kg/m   Wt Readings from Last 3 Encounters:  09/23/16 183 lb 4 oz (83.1 kg)  06/23/16 195 lb (88.5 kg)  04/23/16 191 lb (86.6 kg)    Physical Exam  Constitutional: She is oriented to person, place, and time. She appears well-developed and well-nourished.  HENT:  Head: Normocephalic and atraumatic.  Neck: Neck supple.  Cardiovascular: Normal rate and regular rhythm.   Pulmonary/Chest: Effort normal and breath sounds normal.  Abdominal: Soft. Bowel sounds are normal. She exhibits no mass. There is no hepatosplenomegaly. There is no tenderness.  Musculoskeletal: She exhibits no edema.  Lymphadenopathy:    She has no cervical adenopathy.  Neurological: She is alert and oriented to person, place, and time.  Skin: Skin is warm and dry.  Psychiatric: She has a normal mood and affect. Her behavior is normal.  Vitals reviewed.  Assessment & Plan:    Encounter Diagnoses  Name Primary?  . HTN (hypertension), benign Yes  . Substance abuse in remission   . Cigarette nicotine dependence without complication   . Screening for colon cancer     -Check bmp today when she leaves the office -pt was Given iFOBT for colon cancer screening -pt to Continue current rx -pt to follow up in 3 months.  RTO sooner prn

## 2016-09-24 LAB — IFOBT (OCCULT BLOOD): IFOBT: NEGATIVE

## 2016-10-06 ENCOUNTER — Encounter (HOSPITAL_COMMUNITY): Payer: Self-pay

## 2016-10-06 ENCOUNTER — Emergency Department (HOSPITAL_COMMUNITY)
Admission: EM | Admit: 2016-10-06 | Discharge: 2016-10-06 | Disposition: A | Discharge status: Home / Self Care | Payer: Self-pay | Attending: Emergency Medicine | Admitting: Emergency Medicine

## 2016-10-06 DIAGNOSIS — F17210 Nicotine dependence, cigarettes, uncomplicated: Secondary | ICD-10-CM | POA: Insufficient documentation

## 2016-10-06 DIAGNOSIS — Z79899 Other long term (current) drug therapy: Secondary | ICD-10-CM | POA: Insufficient documentation

## 2016-10-06 DIAGNOSIS — I10 Essential (primary) hypertension: Secondary | ICD-10-CM | POA: Insufficient documentation

## 2016-10-06 DIAGNOSIS — K047 Periapical abscess without sinus: Secondary | ICD-10-CM | POA: Insufficient documentation

## 2016-10-06 MED ORDER — AMOXICILLIN 500 MG PO CAPS: 500.0000 mg | ORAL_CAPSULE | Freq: Three times a day (TID) | ORAL | 0 refills | Status: DC

## 2016-10-06 MED ORDER — IBUPROFEN 600 MG PO TABS: 600.0000 mg | ORAL_TABLET | Freq: Four times a day (QID) | ORAL | 0 refills | Status: AC | PRN

## 2016-10-06 NOTE — ED Triage Notes (Signed)
Pt reports has a broken tooth and pain and swelling started last night.

## 2016-10-06 NOTE — ED Provider Notes (Signed)
[facility]-EM[facility]ENCY DEPT Provider Note   CSN: [id number] Arrival date & time: 10/06/16  0804     History   Chief Complaint Chief Complaint  Patient presents with  . Dental [name]    HPI Claire [name] is a 51 y.o. female.  The history is provided by the patient. The history is limited by a language barrier. A language interpreter was used.  Dental [name]   This is a recurrent problem. The current episode started yesterday. The problem occurs constantly. The problem has been gradually worsening. The [name] is moderate. She has tried nothing for the symptoms. The treatment provided no relief.  Pt complains of swelling to her gumline.  Pt has broken teeth  Past Medical History:  Diagnosis Date  . Cirrhosis of liver (HCC)    stage 4  . Hepatitis C   . Hypertension 2012    Patient Active Problem List   Diagnosis Date Noted  . Substance abuse in remission 10/23/2015  . Back [name] 12/13/2014  . Liver fibrosis (HCC) 12/13/2014  . Chronic hepatitis C without hepatic coma (HCC) 10/31/2014  . Generalized anxiety disorder 10/31/2014  . HTN (hypertension), benign 10/31/2014  . Diffuse arthralgia 10/31/2014  . Tobacco abuse 10/31/2014  . Previous back surgery 10/31/2014    Past Surgical History:  Procedure Laterality Date  . ABDOMINAL HYSTERECTOMY  2010  . ANKLE FRACTURE SURGERY    . arm surgery Right 05/2002  . BACK SURGERY  1997  . CESAREAN SECTION  1991  . CHOLECYSTECTOMY  1992  . HEEL SPUR SURGERY Right 2004    OB History    Gravida Para Term Preterm AB Living   3 3 3     3    SAB TAB Ectopic Multiple Live Births                   Home Medications    Prior to Admission medications   Medication Sig Start Date End Date Taking? Authorizing Provider  citalopram (CELEXA) 20 MG tablet Take 20 mg by mouth daily.    [provider]  lisinopril (PRINIVIL,ZESTRIL) 10 MG tablet TAKE ONE TABLET BY MOUTH ONCE DAILY 05/04/16   Jacquelin HawkingMcElroy, Shannon, PA-C  traZODone (DESYREL) 100  MG tablet Take 100 mg by mouth at bedtime.    [provider]    Family History Family History  Problem Relation Age of Onset  . Hypertension Mother   . Diabetes Mother   . Hyperlipidemia Mother   . Heart disease Father   . Hypertension Father   . Diabetes Father   . Hyperlipidemia Father   . Cancer Son        Brain Cancer 2006  . Hypertension Sister   . Diabetes Sister   . Diabetes Brother   . Hypertension Brother     Social History Social History  Substance Use Topics  . Smoking status: Current Every Day Smoker    Packs/day: 0.25    Years: 30.00    Types: Cigarettes  . Smokeless tobacco: Never Used  . Alcohol use No     Comment: plans to attend smoking cessation class     Allergies   Toradol [ketorolac tromethamine]   Review of Systems Review of Systems  All other systems reviewed and are negative.    Physical Exam Updated Vital Signs BP (!) 127/91 (BP Location: Left Arm)   Pulse 73   Temp 97.6 F (36.4 C) (Oral)   Resp 20   Ht 5\' 4"  (1.626 m)  Wt 83 kg (183 lb)   SpO2 100%   BMI 31.41 kg/m   Physical Exam  Constitutional: She appears well-developed and well-nourished.  HENT:  Head: Normocephalic.  Dental  Decay upper gumline,  Teeth broken into gumline  Eyes: Pupils are equal, round, and reactive to light.  Cardiovascular: Normal rate.   Pulmonary/Chest: Effort normal.  Skin: Skin is warm.  Psychiatric: She has a normal mood and affect.  Nursing note and vitals reviewed.    ED Treatments / Results  Labs (all labs ordered are listed, but only abnormal results are displayed) Labs Reviewed - No data to display  EKG  EKG Interpretation None       Radiology No results found.  Procedures Procedures (including critical care time)  Medications Ordered in ED Medications - No data to display   Initial Impression / Assessment and Plan / ED Course  I have reviewed the triage vital signs and the nursing notes.  Pertinent  labs & imaging results that were available during my care of the patient were reviewed by me and considered in my medical decision making (see chart for details).    Meds ordered this encounter  Medications  . ibuprofen (ADVIL,MOTRIN) 600 MG tablet    Sig: Take 1 tablet (600 mg total) by mouth every 6 (six) hours as needed.    Dispense:  30 tablet    Refill:  0    Order Specific Question:   Supervising Provider    Answer:   MILLER, BRIAN [[id number]]  . amoxicillin (AMOXIL) 500 MG capsule    Sig: Take 1 capsule (500 mg total) by mouth 3 (three) times daily.    Dispense:  30 capsule    Refill:  0    Order Specific Question:   Supervising Provider    Answer:   Eber Hong [[id number]]    Final Clinical Impressions(s) / ED Diagnoses   Final diagnoses:  Dental infection    New Prescriptions New Prescriptions   No medications on file  An After Visit Summary was printed and given to the patient.    Elson Areas, [place] 10/06/16 0845    Blane Ohara, MD 10/06/16 1500

## 2016-11-23 ENCOUNTER — Ambulatory Visit: Payer: Self-pay | Admitting: Physician Assistant

## 2016-11-26 ENCOUNTER — Telehealth: Payer: Self-pay | Admitting: Student

## 2016-11-26 NOTE — Telephone Encounter (Signed)
Pt called stating she began feeling sick after receiving the flu shot on Monday, Oct. 1, 2018. Pt c/o body aches, vomit, sore throat, phlegm, congestion, cough, headache, runny nose, sneezing, and feeling hot like having a fever (pt states did not check her temp). Pt thinks it may be the flu. Pt states she has been taking mucinex cold and flu, ibuprofen, and amoxicillin (given by dentist for her tooth).  Considering pt called at the end of the day before the weekend; pt was advised to try robitussin for cough, tylenol for fevers (if needed), continue mucinex for mucous and phlegm, ibuprofen for body aches, pt was advised to do warm salt water gargles for sore throat. pt was instructed to get plenty of rest and to drink plenty of fluids  Pt was advised to call on Monday, Oct. 8, 2018 if no improvements in order to get an appointment scheduled. Pt verbalized understanding.

## 2016-12-03 ENCOUNTER — Other Ambulatory Visit (HOSPITAL_COMMUNITY)
Admission: RE | Admit: 2016-12-03 | Discharge: 2016-12-03 | Disposition: A | Discharge status: Home / Self Care | Payer: Self-pay | Source: Ambulatory Visit | Attending: Physician Assistant | Admitting: Physician Assistant

## 2016-12-03 ENCOUNTER — Ambulatory Visit: Payer: Self-pay | Admitting: Physician Assistant

## 2016-12-03 ENCOUNTER — Encounter: Payer: Self-pay | Admitting: Physician Assistant

## 2016-12-03 VITALS — BP 110/68 | HR 74 | Temp 97.3°F | Ht 64.25 in | Wt 183.0 lb

## 2016-12-03 DIAGNOSIS — F1911 Other psychoactive substance abuse, in remission: Secondary | ICD-10-CM

## 2016-12-03 DIAGNOSIS — J209 Acute bronchitis, unspecified: Secondary | ICD-10-CM

## 2016-12-03 DIAGNOSIS — Z1239 Encounter for other screening for malignant neoplasm of breast: Secondary | ICD-10-CM

## 2016-12-03 DIAGNOSIS — I10 Essential (primary) hypertension: Secondary | ICD-10-CM

## 2016-12-03 DIAGNOSIS — F17219 Nicotine dependence, cigarettes, with unspecified nicotine-induced disorders: Secondary | ICD-10-CM

## 2016-12-03 LAB — BASIC METABOLIC PANEL
Anion gap: 10 (ref 5–15)
BUN: 11 mg/dL (ref 6–20)
CO2: 26 mmol/L (ref 22–32)
Calcium: 8.9 mg/dL (ref 8.9–10.3)
Chloride: 99 mmol/L — ABNORMAL LOW (ref 101–111)
Creatinine, Ser: 0.88 mg/dL (ref 0.44–1.00)
GFR calc Af Amer: >60 mL/min (ref >60)
GFR calc non Af Amer: >60 mL/min (ref >60)
Glucose, Bld: 92 mg/dL (ref 65–99)
Potassium: 4.2 mmol/L (ref 3.5–5.1)
Sodium: 135 mmol/L (ref 135–145)

## 2016-12-03 MED ORDER — ALBUTEROL SULFATE HFA 108 (90 BASE) MCG/ACT IN AERS: 2.0000 | INHALATION_SPRAY | Freq: Four times a day (QID) | RESPIRATORY_TRACT | 1 refills | Status: AC | PRN

## 2016-12-03 MED ORDER — PREDNISONE 50 MG PO TABS: 50.0000 mg | ORAL_TABLET | Freq: Every day | ORAL | 0 refills | Status: AC

## 2016-12-03 MED ORDER — ALBUTEROL SULFATE HFA 108 (90 BASE) MCG/ACT IN AERS: 2.0000 | INHALATION_SPRAY | Freq: Four times a day (QID) | RESPIRATORY_TRACT | 0 refills | Status: DC | PRN

## 2016-12-03 MED ORDER — AMOXICILLIN 500 MG PO CAPS: 500.0000 mg | ORAL_CAPSULE | Freq: Three times a day (TID) | ORAL | 0 refills | Status: AC

## 2016-12-03 MED ORDER — BENZONATATE 100 MG PO CAPS: ORAL_CAPSULE | ORAL | 2 refills | Status: DC

## 2016-12-03 NOTE — Progress Notes (Signed)
BP 110/68 (BP Location: Left Arm, Patient Position: Sitting, Cuff Size: Normal)   Pulse 74   Temp (!) 97.3 F (36.3 C)   Ht 5' 4.25" (1.632 m)   Wt 183 lb (83 kg)   SpO2 96%   BMI 31.17 kg/m    Subjective:    Patient ID: Claire [name], female    DOB: [birth year], 51 y.o.   MRN: [id number]  HPI: Claire [name] is a 51 y.o. female presenting on 12/03/2016 for Cough (nasal congestion, runny nose, cough, yellow green phlegm, and body aches. pt is taking mucinex, robitussin and motrin. pt states symtoms began 11-23-16)   HPI   Chief Complaint  Patient presents with  . Cough    nasal congestion, runny nose, cough, yellow green phlegm, and body aches. pt is taking mucinex, robitussin and motrin. pt states symtoms began 11-23-16   Pt still smoking but doing well avoiding her other drugs of addiction   Relevant past medical, surgical, family and social history reviewed and updated as indicated. Interim medical history since our last visit reviewed. Allergies and medications reviewed and updated.   Current Outpatient Prescriptions:  .  citalopram (CELEXA) 20 MG tablet, Take 20 mg by mouth daily., Disp: , Rfl:  .  ibuprofen (ADVIL,MOTRIN) 600 MG tablet, Take 1 tablet (600 mg total) by mouth every 6 (six) hours as needed., Disp: 30 tablet, Rfl: 0 .  lisinopril (PRINIVIL,ZESTRIL) 10 MG tablet, TAKE ONE TABLET BY MOUTH ONCE DAILY, Disp: 30 tablet, Rfl: 3 .  traZODone (DESYREL) 100 MG tablet, Take 100 mg by mouth at bedtime., Disp: , Rfl:    Review of Systems  Constitutional: Positive for diaphoresis and fatigue. Negative for appetite change, chills, fever and unexpected weight change.  HENT: Positive for congestion, sneezing and sore throat. Negative for dental problem, drooling, ear pain, facial swelling, hearing loss, mouth sores, trouble swallowing and voice change.   Eyes: Negative for pain, discharge, redness, itching and visual disturbance.  Respiratory: Positive for cough, chest  tightness, shortness of breath and wheezing. Negative for choking.   Cardiovascular: Negative for chest pain, palpitations and leg swelling.  Gastrointestinal: Negative for abdominal pain, blood in stool, constipation, diarrhea and vomiting.  Endocrine: Negative for cold intolerance, heat intolerance and polydipsia.  Genitourinary: Negative for decreased urine volume, dysuria and hematuria.  Musculoskeletal: Negative for arthralgias, back pain and gait problem.  Skin: Negative for rash.  Allergic/Immunologic: Negative for environmental allergies.  Neurological: Positive for headaches. Negative for seizures, syncope and light-headedness.  Hematological: Negative for adenopathy.  Psychiatric/Behavioral: Negative for agitation, dysphoric mood and suicidal ideas. The patient is not nervous/anxious.     Per HPI unless specifically indicated above     Objective:    BP 110/68 (BP Location: Left Arm, Patient Position: Sitting, Cuff Size: Normal)   Pulse 74   Temp (!) 97.3 F (36.3 C)   Ht 5' 4.25" (1.632 m)   Wt 183 lb (83 kg)   SpO2 96%   BMI 31.17 kg/m   Wt Readings from Last 3 Encounters:  12/03/16 183 lb (83 kg)  10/06/16 183 lb (83 kg)  09/23/16 183 lb 4 oz (83.1 kg)    Physical Exam  Constitutional: She is oriented to person, place, and time. She appears well-developed and well-nourished.  HENT:  Head: Normocephalic and atraumatic.  Right Ear: Hearing, tympanic membrane, external ear and ear canal normal.  Left Ear: Hearing, tympanic membrane, external ear and ear canal normal.  Nose: Nose normal.  Mouth/Throat: Uvula is midline and oropharynx is clear and moist. No oropharyngeal exudate.  Neck: Neck supple.  Cardiovascular: Normal rate and regular rhythm.   Pulmonary/Chest: Effort normal and breath sounds normal. She has no wheezes.  Abdominal: Soft. Bowel sounds are normal. She exhibits no mass. There is no hepatosplenomegaly. There is no tenderness.  Musculoskeletal: She  exhibits no edema.  Lymphadenopathy:    She has no cervical adenopathy.  Neurological: She is alert and oriented to person, place, and time.  Skin: Skin is warm and dry.  Psychiatric: She has a normal mood and affect. Her behavior is normal.  Vitals reviewed.       Assessment & Plan:   Encounter Diagnoses  Name Primary?  . HTN (hypertension), benign Yes  . Acute bronchitis, unspecified organism   . Cigarette nicotine dependence with nicotine-induced disorder   . Screening for breast cancer   . Substance abuse in remission [facility])      -will order screening Mammogram -Sign up medassist for albuterol inhaler.  -rx amoxil, tessalon, albuterol, prednisone. -counseled pt to avoid smoking to help improve bronchitis -pt to continue counseling for substance abuse -continue current for HTN -Needs to get bmp. Will call pt with resutls -pt to follow up 3 months. RTO sooner prn

## 2016-12-22 ENCOUNTER — Other Ambulatory Visit: Payer: Self-pay | Admitting: Physician Assistant

## 2016-12-22 DIAGNOSIS — Z1231 Encounter for screening mammogram for malignant neoplasm of breast: Secondary | ICD-10-CM

## 2016-12-23 ENCOUNTER — Ambulatory Visit: Payer: Self-pay | Admitting: Physician Assistant

## 2016-12-24 ENCOUNTER — Ambulatory Visit: Payer: Self-pay | Admitting: Physician Assistant

## 2017-03-08 ENCOUNTER — Ambulatory Visit: Payer: Self-pay | Admitting: Physician Assistant

## 2017-03-15 ENCOUNTER — Encounter: Payer: Self-pay | Admitting: Physician Assistant

## 2017-04-08 IMAGING — CT CT ANGIO CHEST
2 of 6 series · 18 of 36 positions shown · IV contrast (Isovue)
Comparison: Chest x-ray from earlier same day.

CLINICAL DATA: Pleuritic right-sided chest pain.

EXAM:
CT ANGIOGRAPHY CHEST WITH CONTRAST
TECHNIQUE: Multidetector CT imaging of the chest was performed using the
standard protocol during bolus administration of intravenous
contrast. Multiplanar CT image reconstructions and MIPs were
obtained to evaluate the vascular anatomy.
CONTRAST:  100 cc Isovue 370

[Series 5: thins · axial · 0.68mm/px · z∈[+1275,+1532]mm · 17 of 287 slices shown]
[im 15/287  lung]
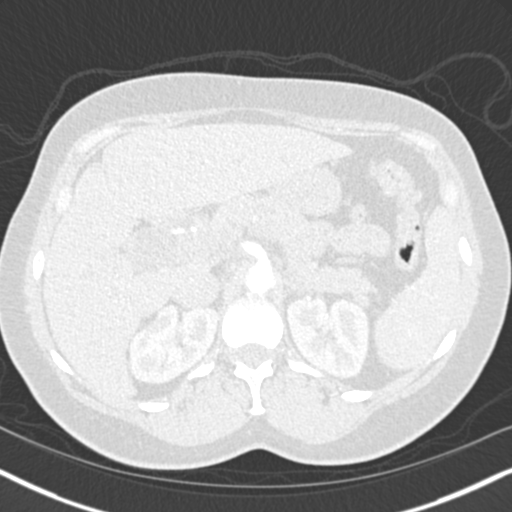
[im 29/287  mediastinal]
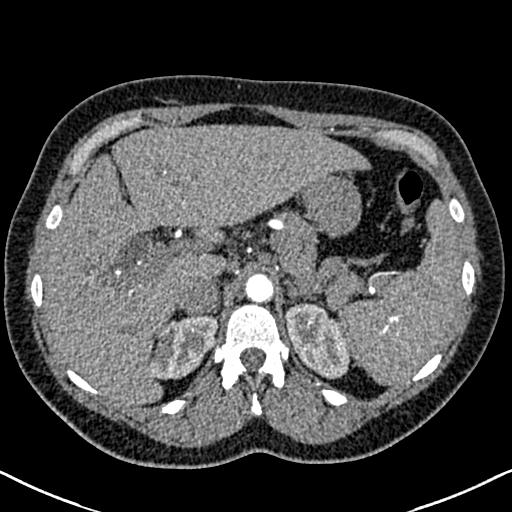
[im 43/287  lung]
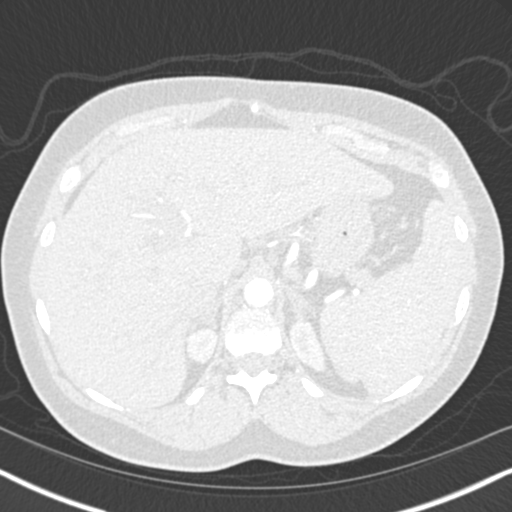
[im 58/287  mediastinal]
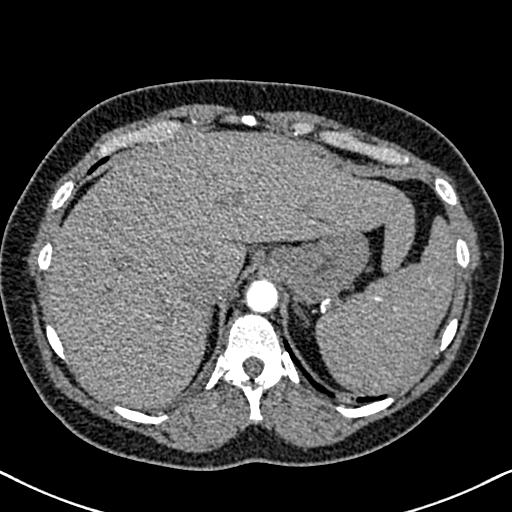
[im 86/287  lung]
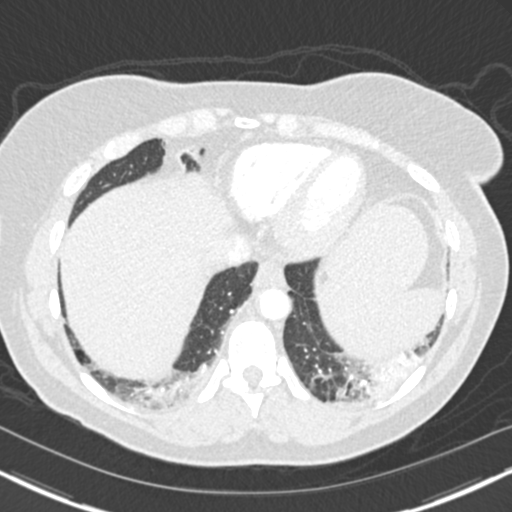
[im 101/287  mediastinal]
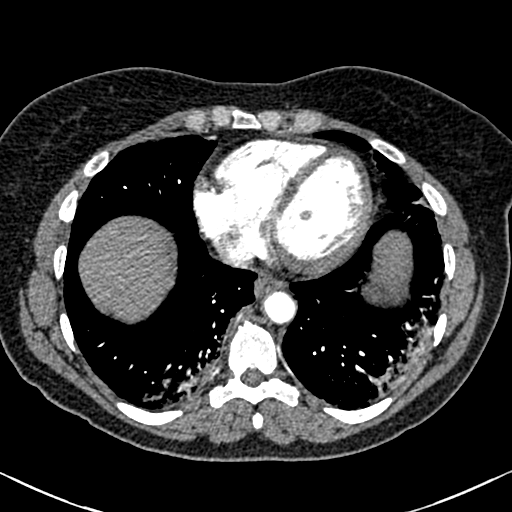
[im 115/287  lung]
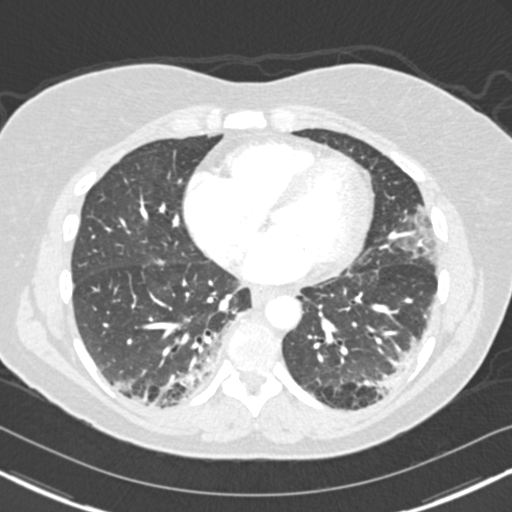
[im 129/287  mediastinal]
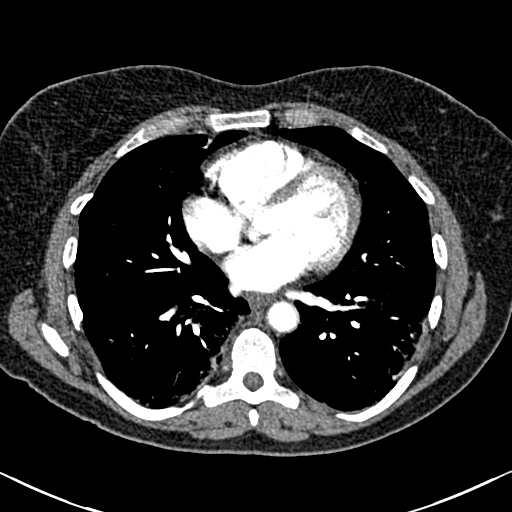
[im 144/287  lung]
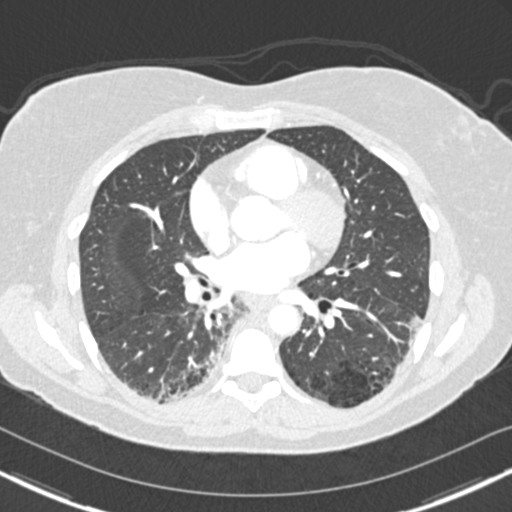
[im 158/287  mediastinal]
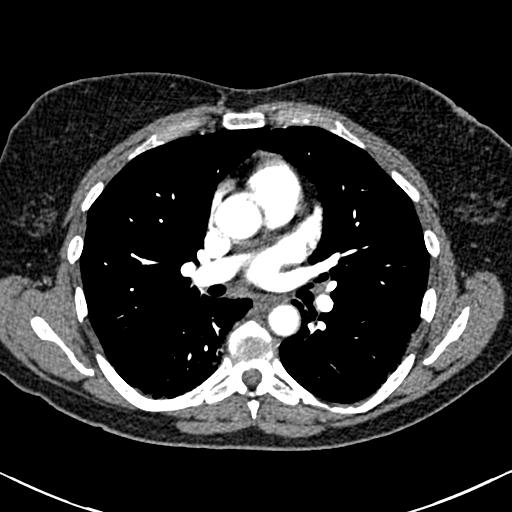
[im 172/287  lung]
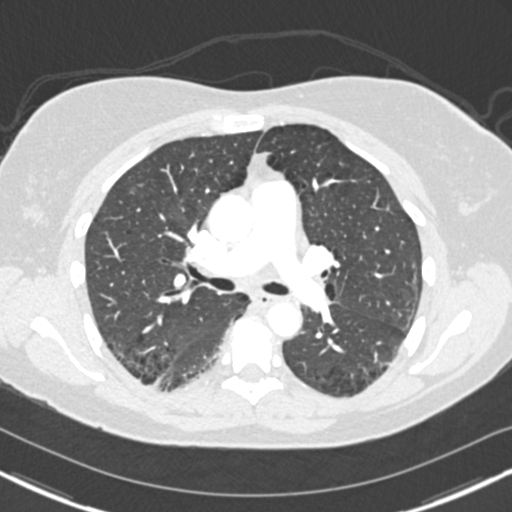
[im 186/287  mediastinal]
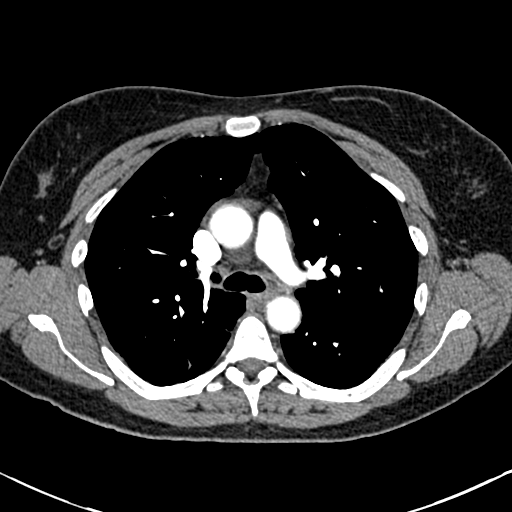
[im 201/287  lung]
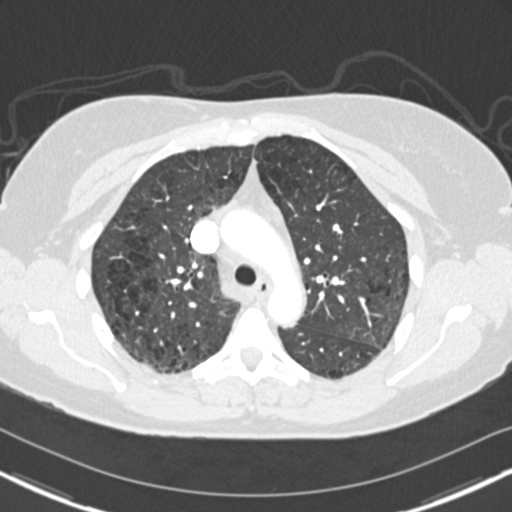
[im 229/287  mediastinal]
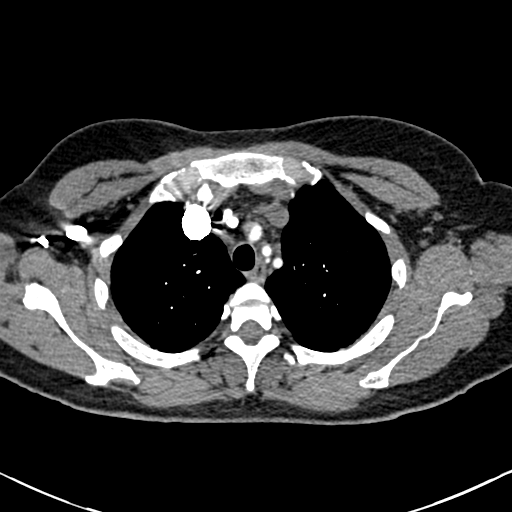
[im 244/287  lung]
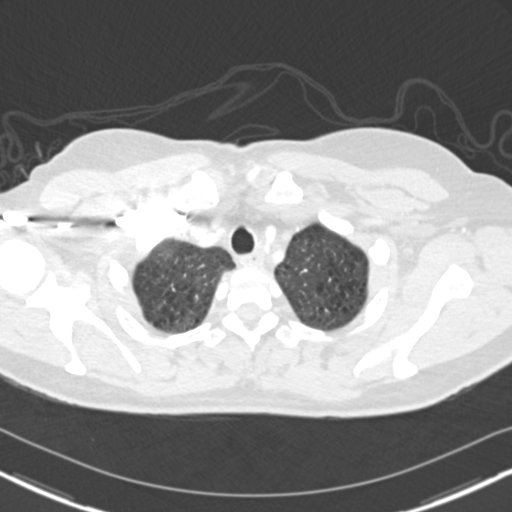
[im 258/287  mediastinal]
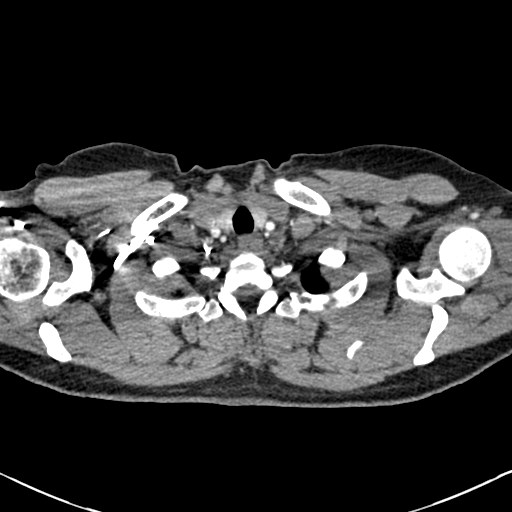
[im 272/287  lung]
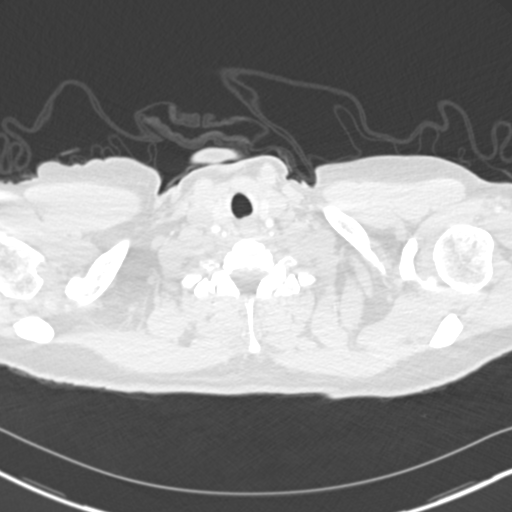

[Series 7: coronal mpr · coronal · 0.60mm/px · 1 of 151 slices shown]
[im 76/151  mediastinal]
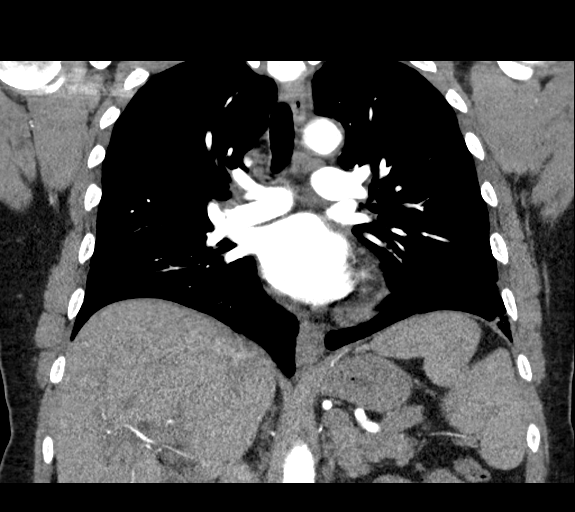

[18 of 36 positions shown; findings below may reference images not displayed]

FINDINGS: Cardiovascular: There is no pulmonary embolism within the main,
lobar or segmental pulmonary arteries bilaterally

Thoracic aorta is normal in caliber. No aortic aneurysm or
dissection. Heart size is normal. No pericardial effusion

Mediastinum/Nodes: Scattered small lymph nodes within the
mediastinum, none of which are pathologic by CT size criteria.
Esophagus is unremarkable. Trachea and central bronchi appear
normal.

Lungs/Pleura: Bilateral upper lobe predominant emphysematous change
and fibrosis, at least moderate in degree. Patchy dependent
consolidations bilaterally are most likely atelectasis, possibly
chronic.

No pleural effusion.  No pneumothorax.

Upper Abdomen: Limited images of the upper abdomen are unremarkable.
Patient is status post cholecystectomy.

Musculoskeletal: Mild degenerative spurring within the thoracic
spine. No acute or suspicious osseous finding.

Review of the MIP images confirms the above findings.
IMPRESSION: 1. No pulmonary embolism.
2. No aortic aneurysm or dissection.
3. Heart size is normal.  No pericardial effusion.
4. Bilateral upper lobe predominant emphysematous change and
fibrosis, moderate to severe in degree for age.
5. Patchy consolidations within the dependent lower lobes
bilaterally, most likely atelectasis, possibly chronic. Pneumonia is
considered much less likely unless febrile. Lungs otherwise clear.

## 2017-05-04 ENCOUNTER — Ambulatory Visit: Payer: Self-pay | Admitting: Physician Assistant

## 2017-05-10 ENCOUNTER — Ambulatory Visit: Payer: Self-pay | Admitting: Physician Assistant

## 2017-05-10 ENCOUNTER — Encounter: Payer: Self-pay | Admitting: Physician Assistant

## 2017-05-10 VITALS — BP 131/86 | HR 85 | Temp 97.5°F | Ht 64.25 in | Wt 171.5 lb

## 2017-05-10 DIAGNOSIS — Z13220 Encounter for screening for lipoid disorders: Secondary | ICD-10-CM

## 2017-05-10 DIAGNOSIS — F17219 Nicotine dependence, cigarettes, with unspecified nicotine-induced disorders: Secondary | ICD-10-CM

## 2017-05-10 DIAGNOSIS — I10 Essential (primary) hypertension: Secondary | ICD-10-CM

## 2017-05-10 DIAGNOSIS — Z1239 Encounter for other screening for malignant neoplasm of breast: Secondary | ICD-10-CM

## 2017-05-10 MED ORDER — LISINOPRIL 10 MG PO TABS: 10.0000 mg | ORAL_TABLET | Freq: Every day | ORAL | 6 refills | Status: AC

## 2017-05-10 NOTE — Patient Instructions (Signed)
Coping with Quitting Smoking Quitting smoking is a physical and mental challenge. You will face cravings, withdrawal symptoms, and temptation. Before quitting, work with your health care provider to make a plan that can help you cope. Preparation can help you quit and keep you from giving in. How can I cope with cravings? Cravings usually last for 5-10 minutes. If you get through it, the craving will pass. Consider taking the following actions to help you cope with cravings:  Keep your mouth busy: ? Chew sugar-free gum. ? Suck on hard candies or a straw. ? Brush your teeth.  Keep your hands and body busy: ? Immediately change to a different activity when you feel a craving. ? Squeeze or play with a ball. ? Do an activity or a hobby, like making bead jewelry, practicing needlepoint, or working with wood. ? Mix up your normal routine. ? Take a short exercise break. Go for a quick walk or run up and down stairs. ? Spend time in public places where smoking is not allowed.  Focus on doing something kind or helpful for someone else.  Call a friend or family member to talk during a craving.  Join a support group.  Call a quit line, such as 1-800-QUIT-NOW.  Talk with your health care provider about medicines that might help you cope with cravings and make quitting easier for you.  How can I deal with withdrawal symptoms? Your body may experience negative effects as it tries to get used to not having nicotine in the system. These effects are called withdrawal symptoms. They may include:  Feeling hungrier than normal.  Trouble concentrating.  Irritability.  Trouble sleeping.  Feeling depressed.  Restlessness and agitation.  Craving a cigarette.  To manage withdrawal symptoms:  Avoid places, people, and activities that trigger your cravings.  Remember why you want to quit.  Get plenty of sleep.  Avoid coffee and other caffeinated drinks. These may worsen some of your  symptoms.  How can I handle social situations? Social situations can be difficult when you are quitting smoking, especially in the first few weeks. To manage this, you can:  Avoid parties, bars, and other social situations where people might be smoking.  Avoid alcohol.  Leave right away if you have the urge to smoke.  Explain to your family and friends that you are quitting smoking. Ask for understanding and support.  Plan activities with friends or family where smoking is not an option.  What are some ways I can cope with stress? Wanting to smoke may cause stress, and stress can make you want to smoke. Find ways to manage your stress. Relaxation techniques can help. For example:  Breathe slowly and deeply, in through your nose and out through your mouth.  Listen to soothing, relaxing music.  Talk with a family member or friend about your stress.  Light a candle.  Soak in a bath or take a shower.  Think about a peaceful place.  What are some ways I can prevent weight gain? Be aware that many people gain weight after they quit smoking. However, not everyone does. To keep from gaining weight, have a plan in place before you quit and stick to the plan after you quit. Your plan should include:  Having healthy snacks. When you have a craving, it may help to: ? Eat plain popcorn, crunchy carrots, celery, or other cut vegetables. ? Chew sugar-free gum.  Changing how you eat: ? Eat small portion sizes at meals. ?   Eat 4-6 small meals throughout the day instead of 1-2 large meals a day. ? Be mindful when you eat. Do not watch television or do other things that might distract you as you eat.  Exercising regularly: ? Make time to exercise each day. If you do not have time for a long workout, do short bouts of exercise for 5-10 minutes several times a day. ? Do some form of strengthening exercise, like weight lifting, and some form of aerobic exercise, like running or  swimming.  Drinking plenty of water or other low-calorie or no-calorie drinks. Drink 6-8 glasses of water daily, or as much as instructed by your health care provider.  Summary  Quitting smoking is a physical and mental challenge. You will face cravings, withdrawal symptoms, and temptation to smoke again. Preparation can help you as you go through these challenges.  You can cope with cravings by keeping your mouth busy (such as by chewing gum), keeping your body and hands busy, and making calls to family, friends, or a helpline for people who want to quit smoking.  You can cope with withdrawal symptoms by avoiding places where people smoke, avoiding drinks with caffeine, and getting plenty of rest.  Ask your health care provider about the different ways to prevent weight gain, avoid stress, and handle social situations. This information is not intended to replace advice given to you by your health care provider. Make sure you discuss any questions you have with your health care provider. Document Released: 02/07/2016 Document Revised: 02/07/2016 Document Reviewed: 02/07/2016 [employer] Interactive Patient Education  2018 [employer]  

## 2017-05-10 NOTE — Progress Notes (Signed)
BP 131/86 (BP Location: Left Arm, Patient Position: Sitting, Cuff Size: Normal)   Pulse 85   Temp (!) 97.5 F (36.4 C)   Ht 5' 4.25" (1.632 m)   Wt 171 lb 8 oz (77.8 kg)   SpO2 95%   BMI 29.21 kg/m    Subjective:    Patient ID: Claire LombardBillie J Carbary, female    DOB: 04/11/1965, 52 y.o.   MRN: [id number]  HPI: Claire [name] is a 52 y.o. female presenting on 05/10/2017 for Follow-up and Hypertension   HPI  Chief Complaint  Patient presents with  . Follow-up  . Hypertension    Pt is still avoiding drugs.  She is hoping to stop smoking this year. 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   She is feeling well.   Relevant past medical, surgical, family and social history reviewed and updated as indicated. Interim medical history since our last visit reviewed. Allergies and medications reviewed and updated.   Current Outpatient Medications:  .  albuterol (PROVENTIL HFA;VENTOLIN HFA) 108 (90 Base) MCG/ACT inhaler, Inhale 2 puffs into the lungs every 6 (six) hours as needed for wheezing or shortness of breath., Disp: 3 Inhaler, Rfl: 1 .  citalopram (CELEXA) 20 MG tablet, Take 20 mg by mouth daily., Disp: , Rfl:  .  ibuprofen (ADVIL,MOTRIN) 600 MG tablet, Take 1 tablet (600 mg total) by mouth every 6 (six) hours as needed., Disp: 30 tablet, Rfl: 0 .  lisinopril (PRINIVIL,ZESTRIL) 10 MG tablet, TAKE ONE TABLET BY MOUTH ONCE DAILY, Disp: 30 tablet, Rfl: 3 .  traZODone (DESYREL) 100 MG tablet, Take 100 mg by mouth at bedtime., Disp: , Rfl:   Review of Systems  Constitutional: Negative for appetite change, chills, diaphoresis, fatigue, fever and unexpected weight change.  HENT: Negative for congestion, dental problem, drooling, ear pain, facial swelling, hearing loss, mouth sores, sneezing, sore throat, trouble swallowing and voice change.   Eyes: Negative for pain, discharge, redness, itching and visual disturbance.  Respiratory: Negative for cough, choking, shortness of breath and  wheezing.   Cardiovascular: Negative for chest pain, palpitations and leg swelling.  Gastrointestinal: Negative for abdominal pain, blood in stool, constipation, diarrhea and vomiting.  Endocrine: Negative for cold intolerance, heat intolerance and polydipsia.  Genitourinary: Negative for decreased urine volume, dysuria and hematuria.  Musculoskeletal: Negative for arthralgias, back pain and gait problem.  Skin: Negative for rash.  Allergic/Immunologic: Negative for environmental allergies.  Neurological: Negative for seizures, syncope, light-headedness and headaches.  Hematological: Negative for adenopathy.  Psychiatric/Behavioral: Negative for agitation, dysphoric mood and suicidal ideas. The patient is not nervous/anxious.     Per HPI unless specifically indicated above     Objective:    BP 131/86 (BP Location: Left Arm, Patient Position: Sitting, Cuff Size: Normal)   Pulse 85   Temp (!) 97.5 F (36.4 C)   Ht 5' 4.25" (1.632 m)   Wt 171 lb 8 oz (77.8 kg)   SpO2 95%   BMI 29.21 kg/m   Wt Readings from Last 3 Encounters:  05/10/17 171 lb 8 oz (77.8 kg)  12/03/16 183 lb (83 kg)  10/06/16 183 lb (83 kg)    Physical Exam  Constitutional: She is oriented to person, place, and time. She appears well-developed and well-nourished.  HENT:  Head: Normocephalic and atraumatic.  Neck: Neck supple.  Cardiovascular: Normal rate and regular rhythm.  Pulmonary/Chest: Effort normal and breath sounds normal.  Abdominal: Soft. Bowel sounds are normal. She exhibits no mass.  There is no hepatosplenomegaly. There is no tenderness.  Musculoskeletal: She exhibits no edema.  Lymphadenopathy:    She has no cervical adenopathy.  Neurological: She is alert and oriented to person, place, and time.  Skin: Skin is warm and dry.  Psychiatric: She has a normal mood and affect. Her behavior is normal.  Vitals reviewed.       Assessment & Plan:   Encounter Diagnoses  Name Primary?  . HTN  (hypertension), benign Yes  . Screening cholesterol level   . Screening for breast cancer   . Cigarette nicotine dependence with nicotine-induced disorder     -ordered screening mammogram -Check lipids- will call with results -pt to continue current medicaitons -counseled smoking cessation -pt to follow up 6 months.  RTO sooner prn

## 2017-05-17 ENCOUNTER — Other Ambulatory Visit: Payer: Self-pay | Admitting: Physician Assistant

## 2017-05-17 DIAGNOSIS — Z1231 Encounter for screening mammogram for malignant neoplasm of breast: Secondary | ICD-10-CM

## 2017-11-10 ENCOUNTER — Ambulatory Visit: Payer: Self-pay | Admitting: Physician Assistant

## 2017-11-29 ENCOUNTER — Encounter: Payer: Self-pay | Admitting: Physician Assistant

## 2018-04-24 DEATH — deceased
